# Patient Record
Sex: Female | Born: 1980 | Race: Black or African American | Hispanic: No | Marital: Married | Smoking: Never smoker
Health system: Southern US, Community
[De-identification: ages and names within clinical notes are randomized; demographics above are authoritative.]

## PROBLEM LIST (undated history)

## (undated) ENCOUNTER — Inpatient Hospital Stay (HOSPITAL_COMMUNITY): Payer: Self-pay

## (undated) DIAGNOSIS — Z9889 Other specified postprocedural states: Secondary | ICD-10-CM

## (undated) DIAGNOSIS — D649 Anemia, unspecified: Secondary | ICD-10-CM

## (undated) DIAGNOSIS — Z8669 Personal history of other diseases of the nervous system and sense organs: Secondary | ICD-10-CM

---

## 2003-10-15 HISTORY — PX: WISDOM TOOTH EXTRACTION: SHX21

## 2004-09-09 ENCOUNTER — Emergency Department (HOSPITAL_COMMUNITY): Admission: EM | Admit: 2004-09-09 | Discharge: 2004-09-09 | Payer: Self-pay | Admitting: Emergency Medicine

## 2005-02-27 ENCOUNTER — Emergency Department (HOSPITAL_COMMUNITY): Admission: EM | Admit: 2005-02-27 | Discharge: 2005-02-28 | Payer: Self-pay | Admitting: Emergency Medicine

## 2005-03-07 ENCOUNTER — Other Ambulatory Visit: Admission: RE | Admit: 2005-03-07 | Discharge: 2005-03-07 | Payer: Self-pay | Admitting: Obstetrics and Gynecology

## 2005-03-14 ENCOUNTER — Emergency Department (HOSPITAL_COMMUNITY): Admission: EM | Admit: 2005-03-14 | Discharge: 2005-03-14 | Payer: Self-pay | Admitting: Emergency Medicine

## 2005-07-09 ENCOUNTER — Other Ambulatory Visit: Admission: RE | Admit: 2005-07-09 | Discharge: 2005-07-09 | Payer: Self-pay | Admitting: Obstetrics and Gynecology

## 2007-07-14 ENCOUNTER — Emergency Department (HOSPITAL_COMMUNITY): Admission: EM | Admit: 2007-07-14 | Discharge: 2007-07-14 | Payer: Self-pay | Admitting: Emergency Medicine

## 2008-07-14 ENCOUNTER — Emergency Department (HOSPITAL_COMMUNITY): Admission: EM | Admit: 2008-07-14 | Discharge: 2008-07-14 | Payer: Self-pay | Admitting: Emergency Medicine

## 2009-02-06 ENCOUNTER — Ambulatory Visit: Payer: Self-pay | Admitting: Gynecology

## 2009-11-27 ENCOUNTER — Ambulatory Visit: Payer: Self-pay | Admitting: Gynecology

## 2010-01-25 ENCOUNTER — Ambulatory Visit: Payer: Self-pay | Admitting: Gynecology

## 2010-02-13 ENCOUNTER — Other Ambulatory Visit: Admission: RE | Admit: 2010-02-13 | Discharge: 2010-02-13 | Payer: Self-pay | Admitting: Gynecology

## 2010-02-13 ENCOUNTER — Ambulatory Visit: Payer: Self-pay | Admitting: Gynecology

## 2010-05-11 ENCOUNTER — Ambulatory Visit: Payer: Self-pay | Admitting: Gynecology

## 2010-05-14 ENCOUNTER — Ambulatory Visit: Payer: Self-pay | Admitting: Gynecology

## 2010-05-21 ENCOUNTER — Ambulatory Visit: Payer: Self-pay | Admitting: Gynecology

## 2010-05-24 ENCOUNTER — Ambulatory Visit: Payer: Self-pay | Admitting: Gynecology

## 2011-01-14 ENCOUNTER — Inpatient Hospital Stay (HOSPITAL_COMMUNITY): Admission: AD | Admit: 2011-01-14 | Payer: Self-pay | Admitting: Obstetrics & Gynecology

## 2011-01-22 ENCOUNTER — Inpatient Hospital Stay (HOSPITAL_COMMUNITY)
Admission: RE | Admit: 2011-01-22 | Discharge: 2011-01-24 | DRG: 373 | Disposition: A | Discharge status: Home / Self Care | Payer: BC Managed Care – PPO | Source: Ambulatory Visit | Attending: Obstetrics and Gynecology | Admitting: Obstetrics and Gynecology

## 2011-01-22 DIAGNOSIS — O99892 Other specified diseases and conditions complicating childbirth: Secondary | ICD-10-CM | POA: Diagnosis present

## 2011-01-22 DIAGNOSIS — Z22330 Carrier of Group B streptococcus: Secondary | ICD-10-CM

## 2011-01-22 DIAGNOSIS — O480 Post-term pregnancy: Principal | ICD-10-CM | POA: Diagnosis present

## 2011-01-22 LAB — CBC
MCH: 24.3 pg — ABNORMAL LOW (ref 26.0–34.0)
MCV: 76.2 fL — ABNORMAL LOW (ref 78.0–100.0)
Platelets: 189 10*3/uL (ref 150–400)
RBC: 4.37 MIL/uL (ref 3.87–5.11)

## 2011-01-22 LAB — ABO/RH: ABO/RH(D): O POS

## 2011-01-22 LAB — RPR: RPR Ser Ql: NONREACTIVE

## 2011-01-23 LAB — CBC
HCT: 28.9 % — ABNORMAL LOW (ref 36.0–46.0)
MCH: 24.4 pg — ABNORMAL LOW (ref 26.0–34.0)
RBC: 3.77 MIL/uL — ABNORMAL LOW (ref 3.87–5.11)
RDW: 15.8 % — ABNORMAL HIGH (ref 11.5–15.5)
WBC: 9.5 10*3/uL (ref 4.0–10.5)

## 2011-02-21 ENCOUNTER — Other Ambulatory Visit: Payer: Self-pay | Admitting: Obstetrics and Gynecology

## 2013-10-14 HISTORY — PX: BREAST BIOPSY: SHX20

## 2013-10-14 NOTE — L&D Delivery Note (Signed)
Delivery Note Samantha Sweeney presented to MAU with complaints of contractions.  SROM occurred in MAU.  Immediately upon arrival to L&D, she felt the urge to push.  Prior to my arrival, at 4:33 AM a viable female was delivered via Vaginal, Spontaneous Delivery (Presentation: Left Occiput Anterior) by Dr. Josephine Cables, MD.  Upon my arrival to the room, the placenta was still in situ, and baby was skin to skin with mom.  I clamped and cut the cord, and the delivered the placenta without difficulty.  APGAR: 9, 9; weight .   Placenta status: Intact, Spontaneous.  Cord: 3 vessels with the following complications: None.    Anesthesia: None  Episiotomy: None Lacerations: 1st degree;Perineal, hemostatic Suture Repair: n/a Est. Blood Loss (mL): 300  Mom to postpartum.  Baby to Couplet care / Skin to Skin.  Jerelyn Charles 06/28/2014, 4:58 AM

## 2013-11-05 LAB — OB RESULTS CONSOLE GC/CHLAMYDIA
CHLAMYDIA, DNA PROBE: NEGATIVE
Gonorrhea: NEGATIVE

## 2014-01-05 LAB — OB RESULTS CONSOLE HGB/HCT, BLOOD
HCT: 37 %
Hemoglobin: 11.1 g/dL

## 2014-01-05 LAB — OB RESULTS CONSOLE ABO/RH: RH TYPE: POSITIVE

## 2014-01-05 LAB — OB RESULTS CONSOLE HEPATITIS B SURFACE ANTIGEN: Hepatitis B Surface Ag: NEGATIVE

## 2014-01-05 LAB — OB RESULTS CONSOLE RUBELLA ANTIBODY, IGM: RUBELLA: IMMUNE

## 2014-01-05 LAB — OB RESULTS CONSOLE HIV ANTIBODY (ROUTINE TESTING): HIV: NONREACTIVE

## 2014-01-05 LAB — OB RESULTS CONSOLE RPR: RPR: NONREACTIVE

## 2014-01-05 LAB — OB RESULTS CONSOLE ANTIBODY SCREEN: Antibody Screen: NEGATIVE

## 2014-03-24 ENCOUNTER — Other Ambulatory Visit: Payer: Self-pay | Admitting: Obstetrics and Gynecology

## 2014-03-24 DIAGNOSIS — N63 Unspecified lump in unspecified breast: Secondary | ICD-10-CM

## 2014-03-28 ENCOUNTER — Ambulatory Visit
Admission: RE | Admit: 2014-03-28 | Discharge: 2014-03-28 | Disposition: A | Discharge status: Home / Self Care | Payer: Private Health Insurance - Indemnity | Source: Ambulatory Visit | Attending: Obstetrics and Gynecology | Admitting: Obstetrics and Gynecology

## 2014-03-28 ENCOUNTER — Other Ambulatory Visit: Payer: Self-pay | Admitting: Obstetrics and Gynecology

## 2014-03-28 DIAGNOSIS — N63 Unspecified lump in unspecified breast: Secondary | ICD-10-CM

## 2014-03-29 ENCOUNTER — Ambulatory Visit
Admission: RE | Admit: 2014-03-29 | Discharge: 2014-03-29 | Disposition: A | Discharge status: Home / Self Care | Payer: Private Health Insurance - Indemnity | Source: Ambulatory Visit | Attending: Obstetrics and Gynecology | Admitting: Obstetrics and Gynecology

## 2014-03-29 ENCOUNTER — Other Ambulatory Visit: Payer: Self-pay | Admitting: Obstetrics and Gynecology

## 2014-03-29 ENCOUNTER — Ambulatory Visit
Admission: RE | Admit: 2014-03-29 | Discharge: 2014-03-29 | Disposition: A | Discharge status: Home / Self Care | Payer: Managed Care, Other (non HMO) | Source: Ambulatory Visit | Attending: Obstetrics and Gynecology | Admitting: Obstetrics and Gynecology

## 2014-03-29 DIAGNOSIS — N63 Unspecified lump in unspecified breast: Secondary | ICD-10-CM

## 2014-04-28 ENCOUNTER — Ambulatory Visit (INDEPENDENT_AMBULATORY_CARE_PROVIDER_SITE_OTHER): Payer: Managed Care, Other (non HMO) | Admitting: Surgery

## 2014-04-28 ENCOUNTER — Encounter (INDEPENDENT_AMBULATORY_CARE_PROVIDER_SITE_OTHER): Payer: Self-pay | Admitting: Surgery

## 2014-04-28 VITALS — BP 118/70 | HR 68 | Resp 14 | Ht 65.5 in | Wt 193.6 lb

## 2014-04-28 DIAGNOSIS — N611 Abscess of the breast and nipple: Secondary | ICD-10-CM | POA: Insufficient documentation

## 2014-04-28 DIAGNOSIS — N61 Mastitis without abscess: Secondary | ICD-10-CM

## 2014-04-28 NOTE — Progress Notes (Signed)
Chief Complaint:  Mass in left breast-abscess by core biopsy-[redacted] weeks pregnant  History of Present Illness:  Samantha Sweeney is an 33 y.o. female who is followed by Dr. Vanessa Kick.  She had a core biopsy that showed giant cells in an abscess cavity.  She is [redacted] weeks pregnant and wants to breast feed for 1 year after her baby is born (due Sept 15th).    No past medical history on file.  No past surgical history on file.  Current Outpatient Prescriptions  Medication Sig Dispense Refill  . Multiple Vitamin (MULTI VITAMIN DAILY PO) Take by mouth.       No current facility-administered medications for this visit.   Review of patient's allergies indicates no known allergies. Family History  Problem Relation Age of Onset  . Breast cancer Mother 47    diag 2 different times  . Cervical cancer Maternal Grandmother    Social History:   reports that she has never smoked. She does not have any smokeless tobacco history on file. Her alcohol and drug histories are not on file.   REVIEW OF SYSTEMS : Positive for ## ; otherwise negative  Physical Exam:   Blood pressure 118/70, pulse 68, resp. rate 14, height 5' 5.5" (1.664 m), weight 193 lb 9.6 oz (87.816 kg). Body mass index is 31.72 kg/(m^2).  Gen:  WDWN AAF NAD  Neurological: Alert and oriented to person, place, and time. Motor and sensory function is grossly intact  Head: Normocephalic and atraumatic.  Eyes: Conjunctivae are normal. Pupils are equal, round, and reactive to light. No scleral icterus.  Neck: Normal range of motion. Neck supple. No tracheal deviation or thyromegaly present.  Cardiovascular:  SR without murmurs or gallops.  No carotid bruits Breast:  Right breast without masses.  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  It is nontender and not red.   Respiratory: Effort normal.  No respiratory distress. No chest wall tenderness. Breath sounds normal.  No  wheezes, rales or rhonchi.  Abdomen:  Not examined GU:  Not examined Musculoskeletal: Normal range of motion. Extremities are nontender. No cyanosis, edema or clubbing noted Lymphadenopathy: No cervical, preauricular, postauricular or axillary adenopathy is present Skin: Skin is warm and dry. No rash noted. No diaphoresis. No erythema. No pallor. Pscyh: Normal mood and affect. Behavior is normal. Judgment and thought content normal.   LABORATORY RESULTS: No results found for this or any previous visit (from the past 48 hour(s)).   RADIOLOGY RESULTS: No results found.  Problem List: Patient Active Problem List   Diagnosis Date Noted  . Left breast abscess 04/28/2014    Assessment & Plan: Left breast abscess-chronic Discussed management and will observe at the present.  Will see her back in 3 weeks.  She completed her last course of antibiotics 3 weeks ago-will observe for now.      Matt B. Hassell Done, MD, [facility] [phone] beeper [phone]  04/28/2014 1:18 PM

## 2014-05-09 ENCOUNTER — Ambulatory Visit (INDEPENDENT_AMBULATORY_CARE_PROVIDER_SITE_OTHER): Payer: Managed Care, Other (non HMO) | Admitting: General Surgery

## 2014-05-10 ENCOUNTER — Encounter (INDEPENDENT_AMBULATORY_CARE_PROVIDER_SITE_OTHER): Payer: Self-pay | Admitting: Surgery

## 2014-05-10 ENCOUNTER — Ambulatory Visit (INDEPENDENT_AMBULATORY_CARE_PROVIDER_SITE_OTHER): Payer: Managed Care, Other (non HMO) | Admitting: Surgery

## 2014-05-10 VITALS — BP 126/80 | HR 75 | Temp 97.0°F | Ht 65.0 in | Wt 194.0 lb

## 2014-05-10 DIAGNOSIS — N61 Mastitis without abscess: Secondary | ICD-10-CM

## 2014-05-10 DIAGNOSIS — N611 Abscess of the breast and nipple: Secondary | ICD-10-CM

## 2014-05-10 MED ORDER — SULFAMETHOXAZOLE-TRIMETHOPRIM 200-40 MG/5ML PO SUSP: 20.0000 mL | Freq: Two times a day (BID) | ORAL | Status: DC

## 2014-05-10 MED ORDER — DOXYCYCLINE HYCLATE 100 MG PO TABS: 100.0000 mg | ORAL_TABLET | Freq: Two times a day (BID) | ORAL | Status: DC

## 2014-05-10 NOTE — Progress Notes (Signed)
Subjective:     Patient ID: Samantha Sweeney, female   DOB: 1981-05-02, 33 y.o.   MRN: [id number]  HPI  This is a patient who was seen by Dr. Hassell Done a week and half ago with a chronic left breast abscess. She is nearing the end of pregnancy. She came in today because an area opened up draining some purulent fluid. Review of Systems     Objective:   Physical Exam On exam, there is a firm area at the 12:00 position left breast. Inferior to this is a small open area draining clear fluid. After obtaining consent and discussing this with her, I prepped the area Betadine, in the size of lidocaine, an incision with scalpel, and entered an abscess cavity. There was minimal purulent fluid remaining. I then packed it with gauze    Assessment:     Left breast abscess     Plan:     I will place her on Septra suspension as she does not want pills. She will keep her appointment with Dr. Hassell Done in 2 days for wound check

## 2014-05-12 ENCOUNTER — Ambulatory Visit (INDEPENDENT_AMBULATORY_CARE_PROVIDER_SITE_OTHER): Payer: Managed Care, Other (non HMO) | Admitting: Surgery

## 2014-05-12 ENCOUNTER — Encounter (INDEPENDENT_AMBULATORY_CARE_PROVIDER_SITE_OTHER): Payer: Self-pay | Admitting: Surgery

## 2014-05-12 VITALS — BP 126/70 | HR 75 | Temp 98.0°F | Ht 65.0 in | Wt 194.0 lb

## 2014-05-12 DIAGNOSIS — O0001 Abdominal pregnancy with intrauterine pregnancy: Secondary | ICD-10-CM

## 2014-05-12 DIAGNOSIS — N611 Abscess of the breast and nipple: Secondary | ICD-10-CM

## 2014-05-12 DIAGNOSIS — N61 Mastitis without abscess: Secondary | ICD-10-CM

## 2014-05-12 NOTE — Progress Notes (Signed)
Samantha Sweeney 33 y.o.  Body mass index is 32.28 kg/(m^2).  Patient Active Problem List   Diagnosis Date Noted  . Left breast abscess 04/28/2014    No Known Allergies    History reviewed. No pertinent past surgical history. No primary provider on file. No diagnosis found.  [redacted] weeks pregnant with draining area in the left breast. Was on Keflex per Dr. Harrington Challenger.  I told her that we can stop the packing that was begun when [facility] saw her with spontaneous drainage from this area.  Today it looks clean and no drainage was noted.  Will see back in 4 weeks.   Matt B. Hassell Done, MD, [facility] [phone] beeper [phone]  05/12/2014 12:03 PM

## 2014-05-12 NOTE — Patient Instructions (Signed)
Complete course of Keflex Apply gauze dressing to absorb drainage.

## 2014-05-25 ENCOUNTER — Telehealth (INDEPENDENT_AMBULATORY_CARE_PROVIDER_SITE_OTHER): Payer: Self-pay | Admitting: *Deleted

## 2014-05-25 NOTE — Telephone Encounter (Signed)
error 

## 2014-05-26 ENCOUNTER — Ambulatory Visit (INDEPENDENT_AMBULATORY_CARE_PROVIDER_SITE_OTHER): Payer: Managed Care, Other (non HMO) | Admitting: Surgery

## 2014-05-26 ENCOUNTER — Telehealth (INDEPENDENT_AMBULATORY_CARE_PROVIDER_SITE_OTHER): Payer: Self-pay

## 2014-05-26 ENCOUNTER — Encounter (INDEPENDENT_AMBULATORY_CARE_PROVIDER_SITE_OTHER): Payer: Self-pay | Admitting: Surgery

## 2014-05-26 VITALS — BP 130/70 | HR 79 | Temp 97.9°F | Ht 65.0 in | Wt 195.5 lb

## 2014-05-26 DIAGNOSIS — N611 Abscess of the breast and nipple: Secondary | ICD-10-CM

## 2014-05-26 DIAGNOSIS — N61 Mastitis without abscess: Secondary | ICD-10-CM

## 2014-05-26 NOTE — Progress Notes (Signed)
The patient returns to urgent office today for evaluation of recurrent left breast abscess. The wound that had previously been opened appears clean with minimal drainage. However just lateral to this area and the upper breast, there is a small opening with some purulent drainage. She states that this is where she had the previous needle biopsy. She has also noticed some tender fluctuance below this area near the edge of the nipple.  Filed Vitals:   05/26/14 1637  BP: 130/70  Pulse: 79  Temp: 97.9 F (36.6 C)   The open wound in the upper left breast measures about 1.5 cm and is well granulated. 2 cm lateral to this area there is trace punctate opening with purulent drainage. There is some fluctuance in this area. About 2 cm below this area there is a oval shaped transversely oriented fluctuant area measuring 2 x 4 cm. This is very tender.  There is significant firmness in the upper medial part of the left breast.  I prepped the left breast with Betadine and anesthetized with 1% lidocaine. The smaller superior area was opened and a small amount of purulent fluid was expressed. I explored this tunnel with a cotton swab and no further purulence was noted. We packed the wound with quarter inch Nu Gauze. I then opened the fluctuant area inferiorly and a large amount of purulent fluid was expressed. Once we completely evacuate the abscess and packed with gauze. She will remove both areas of packing in 48 hours. She will cover all 3 areas with antibiotic ointment. I will order an ultrasound to see if there are any undrained deeper fluid collections or tunnels.  We will recheck her in 2 weeks or sooner if needed.  Imogene Burn. Georgette Dover, MD, [facility]  General/ Trauma Surgery  05/26/2014 6:09 PM

## 2014-05-26 NOTE — Telephone Encounter (Signed)
Order for u/s asap to ref coord .

## 2014-05-27 ENCOUNTER — Ambulatory Visit
Admission: RE | Admit: 2014-05-27 | Discharge: 2014-05-27 | Disposition: A | Discharge status: Home / Self Care | Payer: Private Health Insurance - Indemnity | Source: Ambulatory Visit | Attending: Surgery | Admitting: Surgery

## 2014-05-27 DIAGNOSIS — N611 Abscess of the breast and nipple: Secondary | ICD-10-CM

## 2014-05-30 ENCOUNTER — Telehealth (INDEPENDENT_AMBULATORY_CARE_PROVIDER_SITE_OTHER): Payer: Self-pay

## 2014-05-30 NOTE — Telephone Encounter (Signed)
Informed pt of Dr Georgette Dover message below with the answers to her question/concerns to her procedure. Pt states that she has appt with her OB dr tomorrow am @ 8. Pt states that she wants to keep that so that she can go over everything with their office. Informed pt that I would let Dr Georgette Dover know. Pt verbalized understanding

## 2014-05-30 NOTE — Telephone Encounter (Signed)
Called pt to see how she was feeling after her breast ultrasound. Pt states that she has been doing good, no issues to report. Informed pt that Dr Georgette Dover would like to drain her abscess in the OR. Pt states that she would like to go over all this with her husband before making a decision. She states however that if this needs to be done she would like to proceed with this, this week while Dr Georgette Dover is DOW. Pt states that she will call me back to inform me what they have decided. Informed pt that I would make Dr Georgette Dover aware. Pt verbalized understanding.

## 2014-05-30 NOTE — Telephone Encounter (Signed)
Pt called back she would like to go ahead and proceed with this. Pt has some concerns, she wanted to know how will this affect her breastfeeding once baby is born, what type of sedation will be used and she is very nervous about anything affecting baby since she will be 36 weeks. Informed her that I would get with Dr Georgette Dover to discuss everything and I would call her back as soon as I received a response.

## 2014-05-30 NOTE — Telephone Encounter (Signed)
This will have to be done under general anesthetic or at least very deep sedation.  She can discuss the risks with her OB.  This will likely keep her from breastfeeding on that side for several weeks until the wound heals up.  It is difficult to predict how extensive the abscess extends, but it looks fairly large on ultrasound.  I understand that she is concerned because of the pregnancy, but I think that it would be riskier to leave the abscess there.  She can come for admission to [facility] today if she would like, but we probably won't have time to drain this until tomorrow.  She can come tomorrow if she would rather not spend the night in the hospital.

## 2014-05-31 ENCOUNTER — Observation Stay (HOSPITAL_COMMUNITY)
Admission: AD | Admit: 2014-05-31 | Discharge: 2014-06-01 | Disposition: A | Discharge status: Home / Self Care | Payer: Managed Care, Other (non HMO) | Source: Ambulatory Visit | Attending: Obstetrics and Gynecology | Admitting: Obstetrics and Gynecology

## 2014-05-31 ENCOUNTER — Encounter (HOSPITAL_COMMUNITY)
Admission: AD | Disposition: A | Discharge status: Home / Self Care | Payer: Self-pay | Source: Ambulatory Visit | Attending: Obstetrics and Gynecology

## 2014-05-31 ENCOUNTER — Encounter (HOSPITAL_COMMUNITY): Payer: Managed Care, Other (non HMO) | Admitting: Anesthesiology

## 2014-05-31 ENCOUNTER — Encounter (HOSPITAL_COMMUNITY): Payer: Self-pay | Admitting: *Deleted

## 2014-05-31 ENCOUNTER — Observation Stay (HOSPITAL_COMMUNITY): Payer: Managed Care, Other (non HMO) | Admitting: Anesthesiology

## 2014-05-31 DIAGNOSIS — N61 Mastitis without abscess: Secondary | ICD-10-CM

## 2014-05-31 DIAGNOSIS — O9989 Other specified diseases and conditions complicating pregnancy, childbirth and the puerperium: Principal | ICD-10-CM

## 2014-05-31 DIAGNOSIS — O99891 Other specified diseases and conditions complicating pregnancy: Principal | ICD-10-CM | POA: Insufficient documentation

## 2014-05-31 DIAGNOSIS — O91119 Abscess of breast associated with pregnancy, unspecified trimester: Secondary | ICD-10-CM | POA: Diagnosis present

## 2014-05-31 HISTORY — PX: BREAST CYST EXCISION: SHX579

## 2014-05-31 LAB — CBC
HCT: 33.7 % — ABNORMAL LOW (ref 36.0–46.0)
Hemoglobin: 11.2 g/dL — ABNORMAL LOW (ref 12.0–15.0)
MCH: 25.3 pg — ABNORMAL LOW (ref 26.0–34.0)
MCHC: 33.2 g/dL (ref 30.0–36.0)
MCV: 76.1 fL — ABNORMAL LOW (ref 78.0–100.0)
Platelets: 241 10*3/uL (ref 150–400)
RBC: 4.43 MIL/uL (ref 3.87–5.11)
RDW: 14 % (ref 11.5–15.5)
WBC: 8.5 10*3/uL (ref 4.0–10.5)

## 2014-05-31 LAB — OB RESULTS CONSOLE GBS: GBS: NEGATIVE

## 2014-05-31 SURGERY — EXCISION, CYST, BREAST
Anesthesia: General | Site: Breast

## 2014-05-31 MED ORDER — PRENATAL MULTIVITAMIN CH: 1.0000 | ORAL_TABLET | Freq: Every day | ORAL | Status: DC

## 2014-05-31 MED ORDER — LACTATED RINGERS IV SOLN
INTRAVENOUS | Status: DC
  Administered 2014-05-31 (×2): via INTRAVENOUS

## 2014-05-31 MED ORDER — MORPHINE SULFATE 4 MG/ML IJ SOLN: 2.0000 mg | INTRAMUSCULAR | Status: DC | PRN

## 2014-05-31 MED ORDER — CITRIC ACID-SODIUM CITRATE 334-500 MG/5ML PO SOLN
ORAL | Status: AC
  Administered 2014-05-31: 30 mL
  Filled 2014-05-31: qty 15

## 2014-05-31 MED ORDER — NEOSTIGMINE METHYLSULFATE 10 MG/10ML IV SOLN
INTRAVENOUS | Status: AC
  Filled 2014-05-31: qty 1

## 2014-05-31 MED ORDER — BUPIVACAINE HCL (PF) 0.25 % IJ SOLN
INTRAMUSCULAR | Status: AC
  Filled 2014-05-31: qty 30

## 2014-05-31 MED ORDER — ONDANSETRON HCL 4 MG/2ML IJ SOLN
INTRAMUSCULAR | Status: AC
  Filled 2014-05-31: qty 2

## 2014-05-31 MED ORDER — ONDANSETRON HCL 4 MG/2ML IJ SOLN
INTRAMUSCULAR | Status: DC | PRN
  Administered 2014-05-31: 4 mg via INTRAVENOUS

## 2014-05-31 MED ORDER — DIPHENHYDRAMINE HCL 25 MG PO CAPS: 25.0000 mg | ORAL_CAPSULE | Freq: Four times a day (QID) | ORAL | Status: DC | PRN

## 2014-05-31 MED ORDER — PROPOFOL 10 MG/ML IV BOLUS
INTRAVENOUS | Status: DC | PRN
  Administered 2014-05-31: 150 mg via INTRAVENOUS

## 2014-05-31 MED ORDER — FENTANYL CITRATE 0.05 MG/ML IJ SOLN
INTRAMUSCULAR | Status: AC
  Filled 2014-05-31: qty 5

## 2014-05-31 MED ORDER — DIPHENHYDRAMINE HCL 50 MG/ML IJ SOLN
25.0000 mg | Freq: Once | INTRAMUSCULAR | Status: AC
  Administered 2014-05-31: 25 mg via INTRAVENOUS

## 2014-05-31 MED ORDER — PHENYLEPHRINE 40 MCG/ML (10ML) SYRINGE FOR IV PUSH (FOR BLOOD PRESSURE SUPPORT)
PREFILLED_SYRINGE | INTRAVENOUS | Status: AC
  Filled 2014-05-31: qty 5

## 2014-05-31 MED ORDER — FENTANYL CITRATE 0.05 MG/ML IJ SOLN
INTRAMUSCULAR | Status: DC | PRN
  Administered 2014-05-31: 100 ug via INTRAVENOUS
  Administered 2014-05-31: 50 ug via INTRAVENOUS
  Administered 2014-05-31: 100 ug via INTRAVENOUS

## 2014-05-31 MED ORDER — SUCCINYLCHOLINE CHLORIDE 20 MG/ML IJ SOLN
INTRAMUSCULAR | Status: AC
  Filled 2014-05-31: qty 10

## 2014-05-31 MED ORDER — HYDROMORPHONE HCL PF 1 MG/ML IJ SOLN: 0.5000 mg | INTRAMUSCULAR | Status: DC | PRN

## 2014-05-31 MED ORDER — GLYCOPYRROLATE 0.2 MG/ML IJ SOLN
INTRAMUSCULAR | Status: AC
  Filled 2014-05-31: qty 4

## 2014-05-31 MED ORDER — FENTANYL CITRATE 0.05 MG/ML IJ SOLN
25.0000 ug | INTRAMUSCULAR | Status: DC | PRN
  Administered 2014-05-31: 25 ug via INTRAVENOUS
  Administered 2014-05-31: 50 ug via INTRAVENOUS

## 2014-05-31 MED ORDER — CALCIUM CARBONATE ANTACID 500 MG PO CHEW: 2.0000 | CHEWABLE_TABLET | ORAL | Status: DC | PRN

## 2014-05-31 MED ORDER — SULFAMETHOXAZOLE-TRIMETHOPRIM 200-40 MG/5ML PO SUSP: 20.0000 mL | Freq: Two times a day (BID) | ORAL | Status: DC

## 2014-05-31 MED ORDER — PHENYLEPHRINE HCL 10 MG/ML IJ SOLN
INTRAMUSCULAR | Status: DC | PRN
  Administered 2014-05-31: 80 ug via INTRAVENOUS
  Administered 2014-05-31 (×2): 40 ug via INTRAVENOUS
  Administered 2014-05-31 (×2): 80 ug via INTRAVENOUS

## 2014-05-31 MED ORDER — DOCUSATE SODIUM 100 MG PO CAPS: 100.0000 mg | ORAL_CAPSULE | Freq: Every day | ORAL | Status: DC

## 2014-05-31 MED ORDER — LIDOCAINE HCL (CARDIAC) 20 MG/ML IV SOLN
INTRAVENOUS | Status: DC | PRN
  Administered 2014-05-31: 20 mg via INTRAVENOUS

## 2014-05-31 MED ORDER — BUPIVACAINE HCL (PF) 0.5 % IJ SOLN
INTRAMUSCULAR | Status: AC
  Filled 2014-05-31: qty 30

## 2014-05-31 MED ORDER — HYDROCODONE-ACETAMINOPHEN 5-325 MG PO TABS: 1.0000 | ORAL_TABLET | ORAL | Status: DC | PRN

## 2014-05-31 MED ORDER — BUPIVACAINE HCL 0.5 % IJ SOLN
INTRAMUSCULAR | Status: DC | PRN
  Administered 2014-05-31: 13 mL

## 2014-05-31 MED ORDER — SULFAMETHOXAZOLE-TMP DS 800-160 MG PO TABS
1.0000 | ORAL_TABLET | Freq: Two times a day (BID) | ORAL | Status: DC
Start: 2014-05-31 — End: 2014-06-01
  Administered 2014-05-31: 1 via ORAL
  Filled 2014-05-31 (×2): qty 1

## 2014-05-31 MED ORDER — SUCCINYLCHOLINE CHLORIDE 20 MG/ML IJ SOLN
INTRAMUSCULAR | Status: DC | PRN
  Administered 2014-05-31: 120 mg via INTRAVENOUS

## 2014-05-31 MED ORDER — ACETAMINOPHEN 325 MG PO TABS: 650.0000 mg | ORAL_TABLET | ORAL | Status: DC | PRN

## 2014-05-31 MED ORDER — FENTANYL CITRATE 0.05 MG/ML IJ SOLN
INTRAMUSCULAR | Status: AC
  Filled 2014-05-31: qty 2

## 2014-05-31 MED ORDER — DIPHENHYDRAMINE HCL 50 MG/ML IJ SOLN
INTRAMUSCULAR | Status: AC
  Filled 2014-05-31: qty 1

## 2014-05-31 MED ORDER — LACTATED RINGERS IV SOLN
INTRAVENOUS | Status: DC
Start: 2014-05-31 — End: 2014-06-01
  Administered 2014-06-01: 02:00:00 via INTRAVENOUS

## 2014-05-31 MED ORDER — PROPOFOL 10 MG/ML IV EMUL
INTRAVENOUS | Status: AC
  Filled 2014-05-31: qty 20

## 2014-05-31 MED ORDER — LIDOCAINE HCL (CARDIAC) 20 MG/ML IV SOLN
INTRAVENOUS | Status: AC
  Filled 2014-05-31: qty 5

## 2014-05-31 MED ORDER — ZOLPIDEM TARTRATE 5 MG PO TABS: 5.0000 mg | ORAL_TABLET | Freq: Every evening | ORAL | Status: DC | PRN

## 2014-05-31 SURGICAL SUPPLY — 3 items
PAD ABD 8X7 1/2 STERILE (GAUZE/BANDAGES/DRESSINGS) ×2 IMPLANT
SUT ETHILON 3 0 PS 1 18 (SUTURE) ×6 IMPLANT
TAPE CLOTH SURG 4X10 WHT LF (GAUZE/BANDAGES/DRESSINGS) ×2 IMPLANT

## 2014-05-31 NOTE — Op Note (Signed)
Operative Note  Samantha Sweeney female 33 y.o. 05/31/2014  PREOPERATIVE DX:  Chronic left breast abscesses  POSTOPERATIVE DX:  Same  PROCEDURE:complex incision, drainage, and debridement of chronic left breast abscess         Surgeon: Odis Hollingshead   Assistants: none  Anesthesia: General endotracheal anesthesia  Indications: this is a 33 year old female who is now [redacted] weeks pregnant. She's had left breast abscesses that have failed medical treatment and simple incision and drainage. She has one in the upper inner quadrant of the left breast and one in the retroareolar position. She now presents for the above procedure.    Procedure Detail:  Her left breast was marked with my initials. She is brought to the operating room placed supine on the operating table and general anesthetic was given. The left breast was sterilely prepped and draped.  There was a previous incision in the 12:00 area. Using a hemostat I bluntly opened up this incision and tunneled toward the upper inner quadrant. I entered a cavity of purulent fluid which was cultured and then evacuated by way of suction. I then extended this incision more medially. I broke up multiple loculated cavities of purulent fluid and debrided some necrotic breast tissue. Bleeding was controlled electrocautery.  There was a small incision just at superior to the nipple areolar complex. Using a hemostat I opened this incision up and got into the retroareolar space where I drained another abscess.  Next, a half-inch Penrose drain was placed through the more superior incision. The tip a drain was placed in the base of the abscess cavity. A quarter-inch Penrose drain was placed in the lateral aspect of the larger incision and placed into the retroareolar cavity. Both drains were then anchored to the skin using nylon suture and a safety pin. 0.5 percent Marcaine was infiltrated into the wounds for local anesthetic effect. A bulky dressing was  applied.  She tolerated the procedures well without any apparent complications and was taken to the recovery in satisfactory condition. Good fetal heart tones were noted.   Estimated Blood Loss:  150 ml         Drains: PENROSE X 2         Blood Given: none          Specimens: abscess fluid sent for culture        Complications:  * No complications entered in OR log *         Disposition: PACU - hemodynamically stable.         Condition: stable

## 2014-05-31 NOTE — Progress Notes (Signed)
abcess noted on left breast with cellulitis around the abcess.  2 small open areas on the breast with no drainage noted.  abcess covered with gauze pad and paper tape by patient.

## 2014-05-31 NOTE — Anesthesia Preprocedure Evaluation (Signed)
Anesthesia Evaluation  Patient identified by MRN, date of birth, ID band Patient awake    Reviewed: Allergy & Precautions, H&P , Patient's Chart, lab work & pertinent test results, reviewed documented beta blocker date and time   Airway Mallampati: II TM Distance: >3 FB Neck ROM: full    Dental no notable dental hx.    Pulmonary  breath sounds clear to auscultation  Pulmonary exam normal       Cardiovascular Rhythm:regular Rate:Normal     Neuro/Psych    GI/Hepatic   Endo/Other    Renal/GU      Musculoskeletal   Abdominal   Peds  Hematology   Anesthesia Other Findings Will wait for NST and send for patient. Pre-op and post op FHR check. Na Citrate . No labs needed.  Reproductive/Obstetrics                           Anesthesia Physical Anesthesia Plan  ASA: II  Anesthesia Plan: General   Post-op Pain Management:    Induction: Intravenous, Cricoid pressure planned and Rapid sequence  Airway Management Planned: Oral ETT and Video Laryngoscope Planned  Additional Equipment:   Intra-op Plan:   Post-operative Plan: Extubation in OR  Informed Consent: I have reviewed the patients History and Physical, chart, labs and discussed the procedure including the risks, benefits and alternatives for the proposed anesthesia with the patient or authorized representative who has indicated his/her understanding and acceptance.   Dental Advisory Given and Dental advisory given  Plan Discussed with: CRNA and Surgeon  Anesthesia Plan Comments: (  Discussed general anesthesia, including possible nausea, instrumentation of airway, sore throat,pulmonary aspiration, etc. I asked if the were any outstanding questions, or  concerns before we proceeded. )        Anesthesia Quick Evaluation

## 2014-05-31 NOTE — Progress Notes (Signed)
Patient is comfortable, tired, tolerating liquids.  Denies feeling contractions, LOP or bleeding, denies fever, reports good fetal movement since in recovery room.  Reports some mild itching , otherwise no complaints.  Filed Vitals:   05/31/14 2000 05/31/14 2001 05/31/14 2006 05/31/14 2008  BP: 111/72     Pulse: 84 84 84 84  Temp: 97.3 F (36.3 C)     Resp: 20     Height:      Weight:      SpO2: 98%  98% 98%   Breast: Left dressing in place, drains in place Abd: gravid, NT Ext: no CT FHT: 135 mod var +accels no decels TOCO: ireg ctx  Lab Results  Component Value Date   WBC 8.5 05/31/2014   HGB 11.2* 05/31/2014   HCT 33.7* 05/31/2014   MCV 76.1* 05/31/2014   PLT 241 05/31/2014    POD#0 s/p complex I&D with debridement of abscess of Left breast PO benadryl for mild itching Spoke with RN regarding antibiotics ordered by surgery.  Pharmacy concerned with Septra given [place].  Asked RN to find out alternatives and discuss with surgery.  I discussed with patient risks of hyperbilirubinemia, jaundice/kernicterus with use near term.  Typically it is avoided after 36 weeks.  Given need for MRSA coverage pending culture results, other abx options may not be appropraite and risk benefit must be weighed.  I advised that a short course of Septra may be warranted, but longer course should be avoided.  Awaiting surgery input. Given Cat 1 tracing, will d/c continuous monitoring overnight to allow pt to sleep.  Advised her to alert RN to any problems overnight.  Allyn Kenner

## 2014-05-31 NOTE — Anesthesia Postprocedure Evaluation (Signed)
  Anesthesia Post Note  Patient: Samantha Sweeney  Procedure(s) Performed: Procedure(s) (LRB): CYST EXCISION BREAST (N/A)  Anesthesia type: GA  Patient location: PACU  Post pain: Pain level controlled  Post assessment: Post-op Vital signs reviewed  Last Vitals:  Filed Vitals:   05/31/14 1900  BP: 115/64  Pulse: 87  Temp:   Resp: 19    Post vital signs: Reviewed  Level of consciousness: sedated  Complications: No apparent anesthesia complications

## 2014-05-31 NOTE — OR Nursing (Signed)
FHR CHECKED UPON ARRIVAL TO ROON 140 BEATS / MINUTE  in OR

## 2014-05-31 NOTE — OR Nursing (Signed)
FHR 123 BEATS / MINUTE  AFTER PROCEDURE

## 2014-05-31 NOTE — Progress Notes (Signed)
Jackolyn Confer MD in to see pt

## 2014-05-31 NOTE — H&P (Signed)
Samantha Sweeney is an 33 y.o. female.   Chief Complaint:  Persistent left breast abscess HPI:   She is now [redacted] week pregnant and for the past 9-10 weeks has had a persistent left breast abscess that has failed simple incision and drainages in the office and 3 rounds of antibiotics. Recent ultrasound demonstrated a 9 cm abscess at the 11 to 12:00 position of the left breast approximately 8 cm from the nipple. The 12:00 position retroareolar was a second abscess measuring approximately 2.7 cm.  History reviewed. No pertinent past medical history.  Past Surgical History  Procedure Laterality Date  . Wisdom tooth extraction  2005    Family History  Problem Relation Age of Onset  . Breast cancer Mother 69    diag 2 different times  . Arthritis Mother   . Cancer Mother   . Cervical cancer Maternal Grandmother   . Cancer Maternal Grandmother   . Diabetes Father    Social History:  reports that she has never smoked. She does not have any smokeless tobacco history on file. She reports that she does not drink alcohol or use illicit drugs.  Allergies: No Known Allergies  Medications Prior to Admission  Medication Sig Dispense Refill  . Multiple Vitamin (MULTI VITAMIN DAILY PO) Take by mouth.        No results found for this or any previous visit (from the past 48 hour(s)). No results found.  Review of Systems  Constitutional: Negative for fever and chills.    Height 5\' 5"  (1.651 m), weight 196 lb (88.905 kg). Physical Exam  Constitutional: No distress.  overweight  HENT:  Head: Normocephalic and atraumatic.  Cardiovascular: Normal rate and regular rhythm.   Respiratory: Effort normal and breath sounds normal.  Left breast demonstrates an indurated fluctuant area in the upper inner quadrant. There are 3 small, shallow draining incisions at the 12:00 position just above the nipple areolar complex.  GI:  Palpable uterus well above the umbilicus  Neurological: She is alert.  Skin:  Skin is warm and dry.     Assessment/Plan Complex and persistent left breast abscesses that have failed previous management.  Plan: Incision and drainage of left breast abscesses in the operating room. The procedure and risks were discussed with her. Risks include but are not limited to bleeding, wound healing problems, anesthesia, preterm labor, need for second operations, no fistula, potentially will prolonged wound healing. We also talked about the possibility for recurrence. She seems to understand this and agrees with the plan.  Sydny Schnitzler J 05/31/2014, 4:57 PM

## 2014-05-31 NOTE — Transfer of Care (Signed)
Immediate Anesthesia Transfer of Care Note  Patient: Samantha Sweeney  Procedure(s) Performed: Procedure(s) with comments: CYST EXCISION BREAST (N/A) - complex incision, drainage, debridement of left breast abcesses  Patient Location: PACU  Anesthesia Type:General  Level of Consciousness: awake  Airway & Oxygen Therapy: Patient Spontanous Breathing and Patient connected to nasal cannula oxygen  Post-op Assessment: Report given to PACU RN and Post -op Vital signs reviewed and stable  Post vital signs: stable  Complications: No apparent anesthesia complications

## 2014-06-01 ENCOUNTER — Telehealth (INDEPENDENT_AMBULATORY_CARE_PROVIDER_SITE_OTHER): Payer: Self-pay

## 2014-06-01 DIAGNOSIS — O99891 Other specified diseases and conditions complicating pregnancy: Secondary | ICD-10-CM | POA: Diagnosis not present

## 2014-06-01 MED ORDER — CLINDAMYCIN HCL 300 MG PO CAPS: 600.0000 mg | ORAL_CAPSULE | Freq: Three times a day (TID) | ORAL | Status: DC

## 2014-06-01 MED ORDER — CLINDAMYCIN HCL 300 MG PO CAPS: 300.0000 mg | ORAL_CAPSULE | Freq: Three times a day (TID) | ORAL | Status: DC

## 2014-06-01 MED ORDER — HYDROCODONE-ACETAMINOPHEN 5-325 MG PO TABS: 1.0000 | ORAL_TABLET | ORAL | Status: DC | PRN

## 2014-06-01 NOTE — Telephone Encounter (Signed)
Pt returned Tonyas to triage.  Pt states that she is doing fine.  She is aware of her f/u appt 06-06-14.  Anderson Malta

## 2014-06-01 NOTE — Progress Notes (Signed)
Old dressing changed and noted the old dressing covered in bloody drainage.  Penrose drain intact with safety pins and new dressing applied with paper tape.

## 2014-06-01 NOTE — Progress Notes (Signed)
Pt given discharge instructions and verbalizes understanding.  Pt to change dressing daily and keep dry.  Pt to call MD if she starts to run a fever.

## 2014-06-01 NOTE — Addendum Note (Signed)
Addendum created 06/01/14 1042 by Jonna Munro, CRNA   Modules edited: Charges VN, Notes Section   Notes Section:  File: [id number]

## 2014-06-01 NOTE — Telephone Encounter (Signed)
LMOM for pt to call nursing triage.  Called to see how pt was this am after her procedure and to inform her of appt on 8/24 @ 3:40. Please remind pt to be her at least 55mins before.

## 2014-06-01 NOTE — Progress Notes (Signed)
33 y.o. G2P1001 [redacted]w[redacted]d HD#1 admitted for 36wks Left Breast  Breast abscess s/p I&D by Surgery.  Pt currently stable with no c/o.  Good FM.  Filed Vitals:   06/01/14 0629  BP: 102/51  Pulse: 74  Temp: 98.4 F (36.9 C)  Resp: 18     Lungs CTA Cor RRR Abd  Soft, gravid, nontender Ex SCDs FHTs  Last night- 120s, good short term variability, NST R Toco  q 10 last night  Results for orders placed during the hospital encounter of 05/31/14 (from the past 24 hour(s))  CBC     Status: Abnormal   Collection Time    05/31/14  4:49 PM      Result Value Ref Range   WBC 8.5  4.0 - 10.5 K/uL   RBC 4.43  3.87 - 5.11 MIL/uL   Hemoglobin 11.2 (*) 12.0 - 15.0 g/dL   HCT 33.7 (*) 36.0 - 46.0 %   MCV 76.1 (*) 78.0 - 100.0 fL   MCH 25.3 (*) 26.0 - 34.0 pg   MCHC 33.2  30.0 - 36.0 g/dL   RDW 14.0  11.5 - 15.5 %   Platelets 241  150 - 400 K/uL  WOUND CULTURE     Status: None   Collection Time    05/31/14  5:39 PM      Result Value Ref Range   Specimen Description BREAST LEFT     Special Requests NONE     Gram Stain       Value: RARE WBC PRESENT,BOTH PMN AND MONONUCLEAR     NO SQUAMOUS EPITHELIAL CELLS SEEN     NO ORGANISMS SEEN     Performed at [facility]   Culture       Value: NO GROWTH 1 DAY     Performed at [facility]   Report Status PENDING      A/P:  HD#1  [redacted]w[redacted]d s/p I&D of L breast abscess.  Awaiting NST for this am- if reactive and not having regular contractions, will DC.  Clindamycin 600  TID for one week and f/u with Surgery and OB in one week.      Debany Vantol A

## 2014-06-01 NOTE — Discharge Instructions (Signed)
May shower tomorrow.  Try to keep left breast dry.  Apply a dry pad to left breast wound daily.  Appointment with Dr. Zella Richer (surgeon) in one week.  Please call [phone] to make appointment.  Please call if you have any wound problems.

## 2014-06-01 NOTE — Progress Notes (Signed)
1 Day Post-Op  Subjective: Not having much pain.  Objective: Vital signs in last 24 hours: Temp:  [97.3 F (36.3 C)-98.4 F (36.9 C)] 98.4 F (36.9 C) (08/19 0629) Pulse Rate:  [67-103] 74 (08/19 0629) Resp:  [17-25] 18 (08/19 0629) BP: (95-115)/(51-74) 102/51 mmHg (08/19 0629) SpO2:  [94 %-99 %] 98 % (08/18 2008) Weight:  [196 lb (88.905 kg)] 196 lb (88.905 kg) (08/18 2000)    Intake/Output from previous day: 08/18 0701 - 08/19 0700 In: 1500 [I.V.:1500] Out: 39 [Blood:50] Intake/Output this shift: Total I/O In: 300 [I.V.:300] Out: -   PE: General- In NAD Left Breast-some dried drainage on dressing, penrose drains in  Lab Results:   Recent Labs  05/31/14 1649  WBC 8.5  HGB 11.2*  HCT 33.7*  PLT 241   BMET No results found for this basename: NA, K, CL, CO2, GLUCOSE, BUN, CREATININE, CALCIUM,  in the last 72 hours PT/INR No results found for this basename: LABPROT, INR,  in the last 72 hours Comprehensive Metabolic Panel: No results found for this basename: na, k, cl, co2, bun, creatinine, glucose, calcium, ast, alt, alkphos, bilitot, prot, albumin     Studies/Results: No results found.  Anti-infectives: Anti-infectives   Start     Dose/Rate Route Frequency Ordered Stop   05/31/14 2200  sulfamethoxazole-trimethoprim (BACTRIM,SEPTRA) 200-40 MG/5ML suspension 20 mL  Status:  Discontinued     20 mL Oral Every 12 hours 05/31/14 2002 05/31/14 2125   05/31/14 2200  sulfamethoxazole-trimethoprim (BACTRIM DS) 800-160 MG per tablet 1 tablet  Status:  Discontinued     1 tablet Oral Every 12 hours 05/31/14 2126 06/01/14 0644      Assessment Principal Problem:   Complex, persistent left breast abscess during pregnancy, antepartum s/p incision and drainage 05/31/14-did well overnight.    LOS: 1 day   Plan: Okay for discharge today from my standpoint. Clindamycin 600 mg tid for one week.  Follow up with me in one week in the office.     Brandy Kabat  J 06/01/2014

## 2014-06-01 NOTE — Progress Notes (Signed)
Ur chart review completed.  

## 2014-06-01 NOTE — Discharge Summary (Signed)
Physician Discharge Summary  Patient ID: Samantha Sweeney MRN: [id number] DOB/AGE: 1981-02-03 33 y.o.  Admit date: 05/31/2014 Discharge date: 06/01/2014  Admission Diagnoses:L breast abscess at 36 weeks  Discharge Diagnoses: same Principal Problem:   Breast abscess during pregnancy, antepartum   Discharged Condition: good  Hospital Course: Uncomplicated I&D by surgery and uncomplicated post op course.  Consults: OB  Significant Diagnostic Studies: labs:   Results for orders placed during the hospital encounter of 05/31/14 (from the past 48 hour(s))  CBC     Status: Abnormal   Collection Time    05/31/14  4:49 PM      Result Value Ref Range   WBC 8.5  4.0 - 10.5 K/uL   RBC 4.43  3.87 - 5.11 MIL/uL   Hemoglobin 11.2 (*) 12.0 - 15.0 g/dL   HCT 33.7 (*) 36.0 - 46.0 %   MCV 76.1 (*) 78.0 - 100.0 fL   MCH 25.3 (*) 26.0 - 34.0 pg   MCHC 33.2  30.0 - 36.0 g/dL   RDW 14.0  11.5 - 15.5 %   Platelets 241  150 - 400 K/uL  WOUND CULTURE     Status: None   Collection Time    05/31/14  5:39 PM      Result Value Ref Range   Specimen Description BREAST LEFT     Special Requests NONE     Gram Stain       Value: RARE WBC PRESENT,BOTH PMN AND MONONUCLEAR     NO SQUAMOUS EPITHELIAL CELLS SEEN     NO ORGANISMS SEEN     Performed at [facility]   Culture       Value: NO GROWTH 1 DAY     Performed at [facility]   Report Status PENDING      Treatments: surgery: I&D of abscess  Discharge Exam: Blood pressure 102/51, pulse 74, temperature 98.4 F (36.9 C), temperature source Oral, resp. rate 18, height 5\' 5"  (1.651 m), weight 88.905 kg (196 lb), SpO2 98.00%.   Disposition: 01-Home or Self Care  Discharge Instructions   Discharge activity:  Up to eat    Complete by:  As directed      Discharge diet:  No restrictions    Complete by:  As directed      Discharge instructions    Complete by:  As directed   Modified bedrest.  Refrain from intercourse.  Count baby's  movements in 1 hour per day- if you don't get 6 in that hour, call.     Do not have sex or do anything that might make you have an orgasm    Complete by:  As directed      Fetal Kick Count:  Lie on our left side for one hour after a meal, and count the number of times your baby kicks.  If it is less than 5 times, get up, move around and drink some juice.  Repeat the test 30 minutes later.  If it is still less than 5 kicks in an hour, notify your doctor.    Complete by:  As directed      LABOR:  When conractions begin, you should start to time them from the beginning of one contraction to the beginning  of the next.  When contractions are 5 - 10 minutes apart or less and have been regular for at least an hour, you should call your health care provider.    Complete by:  As directed  Notify physician for bleeding from the vagina    Complete by:  As directed      Notify physician for blurring of vision or spots before the eyes    Complete by:  As directed      Notify physician for chills or fever    Complete by:  As directed      Notify physician for fainting spells, "black outs" or loss of consciousness    Complete by:  As directed      Notify physician for increase in vaginal discharge    Complete by:  As directed      Notify physician for leaking of fluid    Complete by:  As directed      Notify physician for pain or burning when urinating    Complete by:  As directed      Notify physician for pelvic pressure (sudden increase)    Complete by:  As directed      Notify physician for severe or continued nausea or vomiting    Complete by:  As directed      Notify physician for sudden gushing of fluid from the vagina (with or without continued leaking)    Complete by:  As directed      Notify physician for sudden, constant, or occasional abdominal pain    Complete by:  As directed      Notify physician if baby moving less than usual    Complete by:  As directed              Medication List         clindamycin 300 MG capsule  Commonly known as:  CLEOCIN  Take 2 capsules (600 mg total) by mouth 3 (three) times daily.     HYDROcodone-acetaminophen 5-325 MG per tablet  Commonly known as:  NORCO/VICODIN  Take 1 tablet by mouth every 4 (four) hours as needed for moderate pain.     MULTI VITAMIN DAILY PO  Take by mouth.           Follow-up Information   Follow up with ROSENBOWER,TODD J, MD In 1 week.   Specialty:  General Surgery   Contact information:   [address] [place] [address] [phone]       Signed: Daria Pastures 06/01/2014, 8:05 AM

## 2014-06-01 NOTE — Anesthesia Postprocedure Evaluation (Signed)
  Anesthesia Post-op Note  Patient: Control and instrumentation engineer  Procedure(s) Performed: Procedure(s) with comments: CYST EXCISION BREAST (N/A) - complex incision, drainage, debridement of left breast abcesses  Patient Location: Antenatal  Anesthesia Type:General  Level of Consciousness: awake, alert  and oriented  Airway and Oxygen Therapy: Patient Spontanous Breathing  Post-op Pain: none  Post-op Assessment: Post-op Vital signs reviewed, Patient's Cardiovascular Status Stable, Respiratory Function Stable and Pain level controlled  Post-op Vital Signs: Reviewed and stable  Last Vitals:  Filed Vitals:   06/01/14 0808  BP: 103/52  Pulse: 78  Temp: 36.7 C  Resp: 20    Complications: No apparent anesthesia complications

## 2014-06-02 ENCOUNTER — Telehealth (INDEPENDENT_AMBULATORY_CARE_PROVIDER_SITE_OTHER): Payer: Self-pay

## 2014-06-02 ENCOUNTER — Encounter (HOSPITAL_COMMUNITY): Payer: Self-pay | Admitting: General Surgery

## 2014-06-02 NOTE — Telephone Encounter (Signed)
She had complex incision and drainage of two breast abscesses and is [redacted] weeks pregnant.  Agree with recommendations.

## 2014-06-02 NOTE — Telephone Encounter (Signed)
Pt s/p cyst excision on 8/18 by Dr Zella Richer. Pt is calling into c/o body aches. Pt denies any fevers, chills, n/v. Pt states that her breast has not hurt at all. Incision looks good, no redness,swelling, tenderness. Advised pt that she can take Ibuprofen if needed and she can place heat on her back to help with her backache. Informed pt that I would send Dr Zella Richer a message for any further recommendations. Advised the pt to call us back if she develops any fevers, chills, redness, swelling odors or tenderness around the incision.  Pt verbalized understanding. Please advise.

## 2014-06-03 LAB — WOUND CULTURE: Culture: NO GROWTH

## 2014-06-06 ENCOUNTER — Encounter (INDEPENDENT_AMBULATORY_CARE_PROVIDER_SITE_OTHER): Payer: Managed Care, Other (non HMO) | Admitting: General Surgery

## 2014-06-08 ENCOUNTER — Ambulatory Visit (INDEPENDENT_AMBULATORY_CARE_PROVIDER_SITE_OTHER): Payer: Managed Care, Other (non HMO) | Admitting: General Surgery

## 2014-06-08 VITALS — BP 106/70 | HR 79 | Temp 98.1°F | Ht 65.0 in | Wt 196.5 lb

## 2014-06-08 DIAGNOSIS — Z4889 Encounter for other specified surgical aftercare: Secondary | ICD-10-CM

## 2014-06-08 NOTE — Patient Instructions (Signed)
Continue current wound care

## 2014-06-08 NOTE — Progress Notes (Signed)
Procedure:  Complex incision and drainage of left breast abscess  Date:  05/31/14  Pathology:  Culture demonstrated no growth  History:  She is here for her first postoperative visit. She has not been having any problems. She's been changing the dressing daily.  Exam: General- Is in NAD. Left breast-the breasts is much softer. The wound remains open with 2 Penrose drains in. These were pulled back slightly then reattached to the safety pin. A bulky dressing was applied.  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 Drains had been pulled back slightly.  Plan:  Continue current wound care. Return visit one week.

## 2014-06-10 ENCOUNTER — Encounter (INDEPENDENT_AMBULATORY_CARE_PROVIDER_SITE_OTHER): Payer: Managed Care, Other (non HMO) | Admitting: Surgery

## 2014-06-15 ENCOUNTER — Encounter (INDEPENDENT_AMBULATORY_CARE_PROVIDER_SITE_OTHER): Payer: Managed Care, Other (non HMO) | Admitting: General Surgery

## 2014-06-22 ENCOUNTER — Encounter (INDEPENDENT_AMBULATORY_CARE_PROVIDER_SITE_OTHER): Payer: Managed Care, Other (non HMO) | Admitting: Surgery

## 2014-06-28 ENCOUNTER — Encounter (HOSPITAL_COMMUNITY): Payer: Self-pay

## 2014-06-28 ENCOUNTER — Inpatient Hospital Stay (HOSPITAL_COMMUNITY)
Admission: AD | Admit: 2014-06-28 | Discharge: 2014-06-29 | DRG: 775 | Disposition: A | Discharge status: Home / Self Care | Payer: Managed Care, Other (non HMO) | Source: Ambulatory Visit | Attending: Obstetrics | Admitting: Obstetrics

## 2014-06-28 DIAGNOSIS — O479 False labor, unspecified: Secondary | ICD-10-CM | POA: Diagnosis present

## 2014-06-28 LAB — CBC
HCT: 33.4 % — ABNORMAL LOW (ref 36.0–46.0)
Hemoglobin: 11.2 g/dL — ABNORMAL LOW (ref 12.0–15.0)
MCH: 25.2 pg — AB (ref 26.0–34.0)
MCHC: 33.5 g/dL (ref 30.0–36.0)
MCV: 75.1 fL — ABNORMAL LOW (ref 78.0–100.0)
Platelets: 188 10*3/uL (ref 150–400)
RBC: 4.45 MIL/uL (ref 3.87–5.11)
RDW: 14.7 % (ref 11.5–15.5)
WBC: 6.6 10*3/uL (ref 4.0–10.5)

## 2014-06-28 LAB — TYPE AND SCREEN
ABO/RH(D): O POS
Antibody Screen: NEGATIVE

## 2014-06-28 LAB — RPR

## 2014-06-28 MED ORDER — WITCH HAZEL-GLYCERIN EX PADS: 1.0000 "application " | MEDICATED_PAD | CUTANEOUS | Status: DC | PRN

## 2014-06-28 MED ORDER — LANOLIN HYDROUS EX OINT: TOPICAL_OINTMENT | CUTANEOUS | Status: DC | PRN

## 2014-06-28 MED ORDER — ONDANSETRON HCL 4 MG PO TABS: 4.0000 mg | ORAL_TABLET | ORAL | Status: DC | PRN

## 2014-06-28 MED ORDER — ONDANSETRON HCL 4 MG/2ML IJ SOLN: 4.0000 mg | Freq: Four times a day (QID) | INTRAMUSCULAR | Status: DC | PRN

## 2014-06-28 MED ORDER — OXYCODONE-ACETAMINOPHEN 5-325 MG PO TABS: 2.0000 | ORAL_TABLET | ORAL | Status: DC | PRN

## 2014-06-28 MED ORDER — ONDANSETRON HCL 4 MG/2ML IJ SOLN: 4.0000 mg | INTRAMUSCULAR | Status: DC | PRN

## 2014-06-28 MED ORDER — PRENATAL MULTIVITAMIN CH
1.0000 | ORAL_TABLET | Freq: Every day | ORAL | Status: DC
  Administered 2014-06-28 – 2014-06-29 (×2): 1 via ORAL
  Filled 2014-06-28 (×2): qty 1

## 2014-06-28 MED ORDER — LIDOCAINE HCL (PF) 1 % IJ SOLN
30.0000 mL | INTRAMUSCULAR | Status: DC | PRN
  Filled 2014-06-28: qty 30

## 2014-06-28 MED ORDER — OXYCODONE-ACETAMINOPHEN 5-325 MG PO TABS: 1.0000 | ORAL_TABLET | ORAL | Status: DC | PRN

## 2014-06-28 MED ORDER — SIMETHICONE 80 MG PO CHEW: 80.0000 mg | CHEWABLE_TABLET | ORAL | Status: DC | PRN

## 2014-06-28 MED ORDER — ZOLPIDEM TARTRATE 5 MG PO TABS: 5.0000 mg | ORAL_TABLET | Freq: Every evening | ORAL | Status: DC | PRN

## 2014-06-28 MED ORDER — OXYTOCIN 40 UNITS IN LACTATED RINGERS INFUSION - SIMPLE MED: 62.5000 mL/h | INTRAVENOUS | Status: DC

## 2014-06-28 MED ORDER — IBUPROFEN 600 MG PO TABS
600.0000 mg | ORAL_TABLET | Freq: Four times a day (QID) | ORAL | Status: DC
  Administered 2014-06-28 – 2014-06-29 (×5): 600 mg via ORAL
  Filled 2014-06-28 (×5): qty 1

## 2014-06-28 MED ORDER — LIDOCAINE HCL (PF) 1 % IJ SOLN
INTRAMUSCULAR | Status: AC
  Filled 2014-06-28: qty 30

## 2014-06-28 MED ORDER — OXYTOCIN 40 UNITS IN LACTATED RINGERS INFUSION - SIMPLE MED
INTRAVENOUS | Status: AC
  Filled 2014-06-28: qty 1000

## 2014-06-28 MED ORDER — LACTATED RINGERS IV SOLN: 500.0000 mL | INTRAVENOUS | Status: DC | PRN

## 2014-06-28 MED ORDER — LACTATED RINGERS IV SOLN: INTRAVENOUS | Status: DC

## 2014-06-28 MED ORDER — CITRIC ACID-SODIUM CITRATE 334-500 MG/5ML PO SOLN: 30.0000 mL | ORAL | Status: DC | PRN

## 2014-06-28 MED ORDER — TETANUS-DIPHTH-ACELL PERTUSSIS 5-2.5-18.5 LF-MCG/0.5 IM SUSP: 0.5000 mL | Freq: Once | INTRAMUSCULAR | Status: DC

## 2014-06-28 MED ORDER — SENNOSIDES-DOCUSATE SODIUM 8.6-50 MG PO TABS
2.0000 | ORAL_TABLET | ORAL | Status: DC
  Administered 2014-06-28: 2 via ORAL
  Filled 2014-06-28: qty 2

## 2014-06-28 MED ORDER — FLEET ENEMA 7-19 GM/118ML RE ENEM: 1.0000 | ENEMA | RECTAL | Status: DC | PRN

## 2014-06-28 MED ORDER — BENZOCAINE-MENTHOL 20-0.5 % EX AERO
1.0000 "application " | INHALATION_SPRAY | CUTANEOUS | Status: DC | PRN
  Filled 2014-06-28: qty 56

## 2014-06-28 MED ORDER — DIPHENHYDRAMINE HCL 25 MG PO CAPS: 25.0000 mg | ORAL_CAPSULE | Freq: Four times a day (QID) | ORAL | Status: DC | PRN

## 2014-06-28 MED ORDER — DIBUCAINE 1 % RE OINT: 1.0000 "application " | TOPICAL_OINTMENT | RECTAL | Status: DC | PRN

## 2014-06-28 MED ORDER — OXYTOCIN BOLUS FROM INFUSION
500.0000 mL | INTRAVENOUS | Status: DC
  Administered 2014-06-28: 500 mL via INTRAVENOUS

## 2014-06-28 MED ORDER — ACETAMINOPHEN 325 MG PO TABS: 650.0000 mg | ORAL_TABLET | ORAL | Status: DC | PRN

## 2014-06-28 NOTE — Lactation Note (Signed)
This note was copied from the chart of [place]. Lactation Consultation Note  P2, Ex BF.  Breastfed son for 2 years. Baby sleeping. Older son weaned at 2 years.  She had history of mastitis and persistent left breast abscess. At approximately 26 weeks of pregnancy she had a persistent left breast abscess that has failed simple incision and drainages in the office and 3 rounds of antibiotics.  At 36 weeks a follow up ultrasound demonstrated a 9 cm abscess at the 11 to 12:00 position of the left breast approximately 8 cm from the nipple. The left 12:00 position retroareolar was a second abscess measuring approximately 2.7 cm. 8/18 she was admitted for a more complex left breast I&D (reopened incisions) with drains. According to H&P Cultures were negative.  Currently incision is healing.  Clean dry intact, no drainage.  New tissue visible.   Left nipple is inverted.  Mother states right nipple was originally inverted but everted with breastfeeding. Mother states older child breastfed well on both breasts. Reviewed hand expression.  Right breast good flow of colostrum, left breast glistening of colostrum visible. Demonstrated how to hand pump to evert left nipple with minimal eversion. DEBP everted nipple substantially. Plan is for mother to breastfeed on left breast. If she is unable to latch on left breast, she will pump left breast for 15-20 min massaging her breast as she breastfeeds or pumps. DEBP cleaning and milk storage reviewed and foley cup reviewed. Mom encouraged to feed baby 8-12 times/24 hours and with feeding cues.  Mom made aware of O/P services, breastfeeding support groups, community resources, and our phone # for post-discharge questions.  Encouraged mother to call for assistance with breastfeeding if needed.     Patient Name: Boy Faithlynn Deeley [id number] Date: 06/28/2014     Maternal Data    Feeding Feeding Type: Breast Fed Length of feed: 20 min  LATCH  Score/Interventions                      Lactation Tools Discussed/Used     Consult Status      Carlye Grippe 06/28/2014, 1:52 PM

## 2014-06-28 NOTE — H&P (Signed)
33 y.o. [redacted]w[redacted]d  G2P1001 presents with painful contractions.  On arrival to [facility]U, she was 9 cm and SROM'ed.  Otherwise has good fetal movement and no bleeding.  History reviewed. No pertinent past medical history.  Past Surgical History  Procedure Laterality Date  . Wisdom tooth extraction  2005  . Breast cyst excision N/A 05/31/2014    Procedure: CYST EXCISION BREAST;  Surgeon: Odis Hollingshead, MD;  Location: [facility];  Service: General;  Laterality: N/A;  complex incision, drainage, debridement of left breast abcesses    OB History  Gravida Para Term Preterm AB SAB TAB Ectopic Multiple Living  2 1 1       1     # Outcome Date GA Lbr Len/2nd Weight Sex Delivery Anes PTL Lv  2 CUR           1 TRM 01/22/11 [redacted]w[redacted]d 07:00 3.147 kg (6 lb 15 oz) M SVD EPI N Y      History   Social History  . Marital Status: Married    Spouse Name: N/A    Number of Children: N/A  . Years of Education: N/A   Occupational History  . Not on file.   Social History Main Topics  . Smoking status: Never Smoker   . Smokeless tobacco: Not on file  . Alcohol Use: No     Comment: ocassional before the pregnancy  . Drug Use: No  . Sexual Activity: Yes    Birth Control/ Protection: None   Other Topics Concern  . Not on file   Social History Narrative  . No narrative on file   Review of patient's allergies indicates no known allergies.    Prenatal Transfer Tool  Maternal Diabetes: No Genetic Screening: Declined Maternal Ultrasounds/Referrals: Normal Fetal Ultrasounds or other Referrals:  None Maternal Substance Abuse:  No Significant Maternal Medications:  None Significant Maternal Lab Results: Lab values include: Group B Strep negative  Other PNC: I&D of left breast abscess at 37 weeks.    Filed Vitals:   06/28/14 0428  BP: 147/79  Pulse: 70  Temp:   Resp:      General:  NAD Lungs: CTAB Cardiac: RRR Abdomen:  soft, gravid Ex:  no edema SVE:  Delivered on my arrival FHTs:  130s, mod var  in MAU prior to delivery    A/P   33 y.o. [redacted]w[redacted]d  G2P1001 presents with labor, SROM  FSR/ vtx/ GBS neg  Uncomplicated delivery.  To PP for routine care.   Brandon, [facility]

## 2014-06-28 NOTE — Progress Notes (Signed)
FOB ask to let mom sleep for a little  while

## 2014-06-29 ENCOUNTER — Ambulatory Visit: Payer: Self-pay

## 2014-06-29 LAB — CBC
HCT: 27.8 % — ABNORMAL LOW (ref 36.0–46.0)
HEMOGLOBIN: 9 g/dL — AB (ref 12.0–15.0)
MCH: 24.5 pg — ABNORMAL LOW (ref 26.0–34.0)
MCHC: 32.4 g/dL (ref 30.0–36.0)
MCV: 75.5 fL — ABNORMAL LOW (ref 78.0–100.0)
PLATELETS: 212 10*3/uL (ref 150–400)
RBC: 3.68 MIL/uL — AB (ref 3.87–5.11)
RDW: 14.9 % (ref 11.5–15.5)
WBC: 7.5 10*3/uL (ref 4.0–10.5)

## 2014-06-29 NOTE — Progress Notes (Signed)
Pt and baby are doing well. Baby had circ this am. Lochia-mild IMP/ stable Plan/ routine care.

## 2014-06-29 NOTE — Discharge Summary (Signed)
Obstetric Discharge Summary Reason for Admission: onset of labor Prenatal Procedures: ultrasound Intrapartum Procedures: spontaneous vaginal delivery Postpartum Procedures: none Complications-Operative and Postpartum: 1st degree perineal laceration Hemoglobin  Date Value Ref Range Status  06/29/2014 9.0* 12.0 - 15.0 g/dL Final     DELTA CHECK NOTED     REPEATED TO VERIFY  01/05/2014 11.1   Final     HCT  Date Value Ref Range Status  06/29/2014 27.8* 36.0 - 46.0 % Final  01/05/2014 37   Final    Physical Exam:  General: alert Lochia: appropriate Uterine Fundus: firm   Discharge Diagnoses: Term Pregnancy-delivered  Discharge Information: Date: 06/29/2014 Activity: pelvic rest Diet: routine Medications: PNV and Ibuprofen Condition: stable Instructions: refer to practice specific booklet Discharge to: home Follow-up Information   Follow up with Jerelyn Charles, MD. Schedule an appointment as soon as possible for a visit in 1 month.   Specialty:  Obstetrics   Contact information:   [place] [address] [phone]       Newborn Data: Live born female  Birth Weight: 7 lb 5.8 oz (3340 g) AP  ANDERSON,MARK E 06/29/2014, 11:55 AM

## 2014-06-29 NOTE — Lactation Note (Addendum)
This note was copied from the chart of [place]. Lactation Consultation Note  Patient Name: Samantha Sweeney [id number] Date: 06/29/2014 Reason for consult: Follow-up assessment Per mom baby was circ'd this am and has been sleepy , @ consult placed the baby skin to skin and the baby latched for 5 mins  And fell asleep , noted a few swallows. Steady flow of colostrum - to  transitional milk noted after baby released. LC reviewed sore nipple and engorgement prevention and tx , per mom will have a DEBP when she goes home.  Due to left breast being S/P abscess and excision in the last mouth and the areola being tough, for now mom plans to pump with an electric  Pump on the left for stimulation and to prevent engorgement and protect milk supply.2 #30 flanges given to mom incase she need to increase when milk comes if needed. Per  Mom will be calling the general surgeon ( as instructed by him ) due to leakage of milk from the excised area.  Mom plans to call to set up and [facility] O/P apt. Mother informed of post-discharge support and given phone number to the lactation department, including services for phone call assistance;  out-patient appointments; and breastfeeding support group. List of other breastfeeding resources in the community given in the handout.  Encouraged mother to call for problems or concerns related to breastfeeding.   Maternal Data Has patient been taught Hand Expression?: Yes  Feeding Feeding Type: Breast Fed Length of feed: 5 min (deep latch with swallows , sleepy from circ )  LATCH Score/Interventions Latch: Grasps breast easily, tongue down, lips flanged, rhythmical sucking.  Audible Swallowing: A few with stimulation  Type of Nipple: Everted at rest and after stimulation  Comfort (Breast/Nipple): Filling, red/small blisters or bruises, mild/mod discomfort  Problem noted: Filling  Hold (Positioning): No assistance needed to correctly position infant at  breast. Intervention(s): Breastfeeding basics reviewed;Support Pillows;Position options;Skin to skin  LATCH Score: 8  Lactation Tools Discussed/Used Tools: Pump;Comfort gels;Flanges Flange Size: 30 (if needed for when the milk comes in ) Breast pump type: Double-Electric Breast Pump   Consult Status Consult Status: Complete Date: 06/29/14 Follow-up type: In-patient    Myer Haff 06/29/2014, 2:42 PM

## 2014-06-30 ENCOUNTER — Encounter (INDEPENDENT_AMBULATORY_CARE_PROVIDER_SITE_OTHER): Payer: Managed Care, Other (non HMO) | Admitting: Surgery

## 2014-08-15 ENCOUNTER — Encounter (HOSPITAL_COMMUNITY): Payer: Self-pay

## 2017-10-14 HISTORY — PX: BREAST SURGERY: SHX581

## 2017-10-14 HISTORY — PX: BREAST EXCISIONAL BIOPSY: SUR124

## 2019-10-15 HISTORY — PX: BREAST EXCISIONAL BIOPSY: SUR124

## 2019-11-18 ENCOUNTER — Other Ambulatory Visit: Payer: Self-pay | Admitting: Obstetrics and Gynecology

## 2019-11-18 DIAGNOSIS — N6320 Unspecified lump in the left breast, unspecified quadrant: Secondary | ICD-10-CM

## 2019-11-19 ENCOUNTER — Other Ambulatory Visit (HOSPITAL_COMMUNITY): Payer: Self-pay | Admitting: *Deleted

## 2019-11-19 DIAGNOSIS — N644 Mastodynia: Secondary | ICD-10-CM

## 2019-11-19 DIAGNOSIS — N6320 Unspecified lump in the left breast, unspecified quadrant: Secondary | ICD-10-CM

## 2019-11-22 ENCOUNTER — Other Ambulatory Visit (HOSPITAL_COMMUNITY): Payer: Self-pay | Admitting: Obstetrics and Gynecology

## 2019-11-22 DIAGNOSIS — N644 Mastodynia: Secondary | ICD-10-CM

## 2019-11-22 DIAGNOSIS — N6320 Unspecified lump in the left breast, unspecified quadrant: Secondary | ICD-10-CM

## 2019-11-23 ENCOUNTER — Other Ambulatory Visit: Payer: Self-pay | Admitting: Student

## 2019-11-23 ENCOUNTER — Encounter (HOSPITAL_COMMUNITY): Payer: Self-pay

## 2019-11-23 ENCOUNTER — Ambulatory Visit
Admission: RE | Admit: 2019-11-23 | Discharge: 2019-11-23 | Disposition: A | Discharge status: Home / Self Care | Payer: Self-pay | Source: Ambulatory Visit | Attending: Obstetrics and Gynecology | Admitting: Obstetrics and Gynecology

## 2019-11-23 ENCOUNTER — Ambulatory Visit
Admission: RE | Admit: 2019-11-23 | Discharge: 2019-11-23 | Disposition: A | Discharge status: Home / Self Care | Payer: No Typology Code available for payment source | Source: Ambulatory Visit | Attending: Obstetrics and Gynecology | Admitting: Obstetrics and Gynecology

## 2019-11-23 ENCOUNTER — Observation Stay (HOSPITAL_COMMUNITY)
Admission: AD | Admit: 2019-11-23 | Discharge: 2019-11-24 | Disposition: A | Discharge status: Home / Self Care | Payer: No Typology Code available for payment source | Source: Ambulatory Visit | Attending: General Surgery | Admitting: General Surgery

## 2019-11-23 ENCOUNTER — Ambulatory Visit (HOSPITAL_COMMUNITY)
Admission: RE | Admit: 2019-11-23 | Discharge: 2019-11-23 | Disposition: A | Discharge status: Home / Self Care | Payer: Self-pay | Source: Ambulatory Visit | Attending: Obstetrics and Gynecology | Admitting: Obstetrics and Gynecology

## 2019-11-23 ENCOUNTER — Other Ambulatory Visit: Payer: Self-pay

## 2019-11-23 DIAGNOSIS — N6320 Unspecified lump in the left breast, unspecified quadrant: Secondary | ICD-10-CM

## 2019-11-23 DIAGNOSIS — Z20822 Contact with and (suspected) exposure to covid-19: Secondary | ICD-10-CM | POA: Insufficient documentation

## 2019-11-23 DIAGNOSIS — N644 Mastodynia: Secondary | ICD-10-CM

## 2019-11-23 DIAGNOSIS — N611 Abscess of the breast and nipple: Principal | ICD-10-CM | POA: Diagnosis present

## 2019-11-23 DIAGNOSIS — Z1239 Encounter for other screening for malignant neoplasm of breast: Secondary | ICD-10-CM | POA: Insufficient documentation

## 2019-11-23 DIAGNOSIS — Z1231 Encounter for screening mammogram for malignant neoplasm of breast: Secondary | ICD-10-CM | POA: Insufficient documentation

## 2019-11-23 DIAGNOSIS — Z803 Family history of malignant neoplasm of breast: Secondary | ICD-10-CM | POA: Insufficient documentation

## 2019-11-23 LAB — MRSA PCR SCREENING: MRSA by PCR: NEGATIVE

## 2019-11-23 MED ORDER — SODIUM CHLORIDE 0.9 % IV SOLN: INTRAVENOUS | Status: DC

## 2019-11-23 MED ORDER — OXYCODONE HCL 5 MG PO TABS
5.0000 mg | ORAL_TABLET | ORAL | Status: DC | PRN
  Administered 2019-11-24: 10 mg via ORAL
  Filled 2019-11-23: qty 2

## 2019-11-23 MED ORDER — ONDANSETRON 4 MG PO TBDP: 4.0000 mg | ORAL_TABLET | Freq: Four times a day (QID) | ORAL | Status: DC | PRN

## 2019-11-23 MED ORDER — ONDANSETRON HCL 4 MG/2ML IJ SOLN: 4.0000 mg | Freq: Four times a day (QID) | INTRAMUSCULAR | Status: DC | PRN

## 2019-11-23 MED ORDER — ACETAMINOPHEN 325 MG PO TABS: 650.0000 mg | ORAL_TABLET | Freq: Four times a day (QID) | ORAL | Status: DC | PRN

## 2019-11-23 MED ORDER — ACETAMINOPHEN 650 MG RE SUPP: 650.0000 mg | Freq: Four times a day (QID) | RECTAL | Status: DC | PRN

## 2019-11-23 MED ORDER — ENOXAPARIN SODIUM 40 MG/0.4ML ~~LOC~~ SOLN
40.0000 mg | SUBCUTANEOUS | Status: DC
  Administered 2019-11-23: 18:00:00 40 mg via SUBCUTANEOUS
  Filled 2019-11-23: qty 0.4

## 2019-11-23 MED ORDER — MORPHINE SULFATE (PF) 2 MG/ML IV SOLN: 1.0000 mg | INTRAVENOUS | Status: DC | PRN

## 2019-11-23 NOTE — H&P (View-Only) (Signed)
Samantha Sweeney Documented: 11/23/2019 3:40 PM Location: [facility] Patient #: [id number] DOB: April 30, 1981 Single / Language: Cleophus Molt / Race: Black or African American Female  History of Present Illness  The patient is a 39 year old female who presents with a breast abscess. She is presenting to the office per request of the [facility]. In 2015, Dr. Zella Richer incised and drained chronic left breast abscesses in the OR. 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 In 2019, she underwent I&D of lower left breast abscess in the OR when she was living in [place].   Since then, she has not had any issues in her left breast until about 2 weeks ago. She completed a ten-day course of clindamycin with no improvement, so she underwent mammogram and ultrasound this morning. I spoke to Dr. Enriqueta Shutter on the phone who describes seeing a 10 cm x 10 cm multiloculated lower left breast abscess. He attempted aspiration with a 12-gauge needle but was only able to aspirate 5 cc of purulent drainage from the largest component of the loculated area. She states the area is very tender. She denies any drainage. Denies fevers.  Allergies No Known Drug Allergies  Medication History No Current Medications  Review of Systems General Not Present- Appetite Loss, Chills, Fatigue, Fever, Night Sweats, Weight Gain and Weight Loss. Skin Not Present- Change in Wart/Mole, Dryness, Hives, Jaundice, New Lesions, Non-Healing Wounds, Rash and Ulcer. HEENT Not Present- Earache, Hearing Loss, Hoarseness, Nose Bleed, Oral Ulcers, Ringing in the Ears, Seasonal Allergies, Sinus Pain, Sore Throat, Visual Disturbances, Wears glasses/contact lenses and Yellow Eyes. Respiratory Not Present- Bloody sputum, Chronic Cough, Difficulty Breathing, Snoring and Wheezing. Breast Present- Breast Mass and Skin Changes. Not Present- Breast Pain  and Nipple Discharge. Cardiovascular Not Present- Chest Pain, Difficulty Breathing Lying Down, Leg Cramps, Palpitations, Rapid Heart Rate, Shortness of Breath and Swelling of Extremities. Gastrointestinal Not Present- Abdominal Pain, Bloating, Bloody Stool, Change in Bowel Habits, Chronic diarrhea, Constipation, Difficulty Swallowing, Excessive gas, Gets full quickly at meals, Hemorrhoids, Indigestion, Nausea, Rectal Pain and Vomiting. Female Genitourinary Not Present- Frequency, Nocturia, Painful Urination, Pelvic Pain and Urgency. Musculoskeletal Not Present- Back Pain, Joint Pain, Joint Stiffness, Muscle Pain, Muscle Weakness and Swelling of Extremities. Neurological Not Present- Decreased Memory, Fainting, Headaches, Numbness, Seizures, Tingling, Tremor, Trouble walking and Weakness. Psychiatric Not Present- Anxiety, Bipolar, Change in Sleep Pattern, Depression, Fearful and Frequent crying. Endocrine Not Present- Cold Intolerance, Excessive Hunger, Hair Changes, Heat Intolerance, Hot flashes and New Diabetes. Hematology Not Present- Easy Bruising, Excessive bleeding, Gland problems, HIV and Persistent Infections.  Vitals 11/23/2019 3:45 PM Weight: 200.2 lb Height: 65in Body Surface Area: 1.98 m Body Mass Index: 33.31 kg/m  Temp.: 96.37F(Tympanic)  Pulse: 106 (Regular)  BP: 126/72 (Sitting, Left Arm, Standard)  Physical Exam  GENERAL: Well-developed, well nourished female in no acute distress Wearing mask  EYES: No scleral icterus Pupils equal, lids normal  RESPIRATORY: Normal effort, no use of accessory muscles  MUSCULOSKELETAL: Normal gait Grossly normal ROM upper extremities Grossly normal ROM lower extremities  SKIN: Warm and dry Not diaphoretic  PSYCHIATRIC: Normal judgement and insight Normal mood and affect Alert, oriented x 3  Breast: Left breast is large and pendulous The majority of the lower breast has a firm mass consistent with abscess There  is a stellate scar in the upper breast from surgery in 2015 There are 2 scars on the  lower breast from surgery in 2019 >> Aspiration was attempted from the largest component based on ultrasound, from the lateral scar on the lower breast There is no overlying cellulitis and the entire area is tender to palpation  Assessment & Plan BREAST ABSCESS OF FEMALE (N61.1) She is presenting to urgent office per request of Dr. Enriqueta Shutter at the [facility]  Aspiration of a large multiloculated left breast abscess was attempted but was not successful.  On exam, the majority of the lower left breast is affected by the abscess. Given her history and exam findings, I asked Dr. Barry Dienes to also evaluate the patient. She believes bedside I&D would not adequately drain the entire abscess and the patient may not tolerate the procedure. Therefore, she recommends I&D in the OR. Dr. Donne Hazel has agreed to see her.  She will be admitted to [facility], NPO after midnight, and posted for tomorrow morning after covid testing is back.  Carlena Hurl, [facility]

## 2019-11-23 NOTE — H&P (Signed)
Samantha Sweeney Documented: 11/23/2019 3:40 PM Location: [facility] Patient #: [phone] DOB: March 24, 1981 Single / Language: Samantha Sweeney / Race: Black or African American Female  History of Present Illness  The patient is a 39 year old female who presents with a breast abscess. She is presenting to the office per request of the [place]. In 2015, Dr. Zella Richer incised and drained chronic left breast abscesses in the OR. 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 In 2019, she underwent I&D of lower left breast abscess in the OR when she was living in [place].   Since then, she has not had any issues in her left breast until about 2 weeks ago. She completed a ten-day course of clindamycin with no improvement, so she underwent mammogram and ultrasound this morning. I spoke to Dr. Enriqueta Shutter on the phone who describes seeing a 10 cm x 10 cm multiloculated lower left breast abscess. He attempted aspiration with a 12-gauge needle but was only able to aspirate 5 cc of purulent drainage from the largest component of the loculated area. She states the area is very tender. She denies any drainage. Denies fevers.  Allergies No Known Drug Allergies  Medication History No Current Medications  Review of Systems General Not Present- Appetite Loss, Chills, Fatigue, Fever, Night Sweats, Weight Gain and Weight Loss. Skin Not Present- Change in Wart/Mole, Dryness, Hives, Jaundice, New Lesions, Non-Healing Wounds, Rash and Ulcer. HEENT Not Present- Earache, Hearing Loss, Hoarseness, Nose Bleed, Oral Ulcers, Ringing in the Ears, Seasonal Allergies, Sinus Pain, Sore Throat, Visual Disturbances, Wears glasses/contact lenses and Yellow Eyes. Respiratory Not Present- Bloody sputum, Chronic Cough, Difficulty Breathing, Snoring and Wheezing. Breast Present- Breast Mass and Skin Changes. Not Present- Breast Pain  and Nipple Discharge. Cardiovascular Not Present- Chest Pain, Difficulty Breathing Lying Down, Leg Cramps, Palpitations, Rapid Heart Rate, Shortness of Breath and Swelling of Extremities. Gastrointestinal Not Present- Abdominal Pain, Bloating, Bloody Stool, Change in Bowel Habits, Chronic diarrhea, Constipation, Difficulty Swallowing, Excessive gas, Gets full quickly at meals, Hemorrhoids, Indigestion, Nausea, Rectal Pain and Vomiting. Female Genitourinary Not Present- Frequency, Nocturia, Painful Urination, Pelvic Pain and Urgency. Musculoskeletal Not Present- Back Pain, Joint Pain, Joint Stiffness, Muscle Pain, Muscle Weakness and Swelling of Extremities. Neurological Not Present- Decreased Memory, Fainting, Headaches, Numbness, Seizures, Tingling, Tremor, Trouble walking and Weakness. Psychiatric Not Present- Anxiety, Bipolar, Change in Sleep Pattern, Depression, Fearful and Frequent crying. Endocrine Not Present- Cold Intolerance, Excessive Hunger, Hair Changes, Heat Intolerance, Hot flashes and New Diabetes. Hematology Not Present- Easy Bruising, Excessive bleeding, Gland problems, HIV and Persistent Infections.  Vitals 11/23/2019 3:45 PM Weight: 200.2 lb Height: 65in Body Surface Area: 1.98 m Body Mass Index: 33.31 kg/m  Temp.: 96.56F(Tympanic)  Pulse: 106 (Regular)  BP: 126/72 (Sitting, Left Arm, Standard)  Physical Exam  GENERAL: Well-developed, well nourished female in no acute distress Wearing mask  EYES: No scleral icterus Pupils equal, lids normal  RESPIRATORY: Normal effort, no use of accessory muscles  MUSCULOSKELETAL: Normal gait Grossly normal ROM upper extremities Grossly normal ROM lower extremities  SKIN: Warm and dry Not diaphoretic  PSYCHIATRIC: Normal judgement and insight Normal mood and affect Alert, oriented x 3  Breast: Left breast is large and pendulous The majority of the lower breast has a firm mass consistent with abscess There  is a stellate scar in the upper breast from surgery in 2015 There are 2 scars on the  lower breast from surgery in 2019 >> Aspiration was attempted from the largest component based on ultrasound, from the lateral scar on the lower breast There is no overlying cellulitis and the entire area is tender to palpation  Assessment & Plan BREAST ABSCESS OF FEMALE (N61.1) She is presenting to urgent office per request of Dr. Enriqueta Shutter at the [facility].  Aspiration of a large multiloculated left breast abscess was attempted but was not successful.  On exam, the majority of the lower left breast is affected by the abscess. Given her history and exam findings, I asked Dr. Barry Dienes to also evaluate the patient. She believes bedside I&D would not adequately drain the entire abscess and the patient may not tolerate the procedure. Therefore, she recommends I&D in the OR. Dr. Donne Hazel has agreed to see her.  She will be admitted to [facility], NPO after midnight, and posted for tomorrow morning after covid testing is back.  Carlena Hurl, [facility]

## 2019-11-23 NOTE — Progress Notes (Signed)
Complaints of painful left breast lump x 2 weeks that has increased in size. Patient states the pain is constant. Patient rates the pain at a 5 out of 10.  Pap Smear: Pap smear not completed today. Last Pap smear was 11/17/2019 per patient at [facility] and patient stated is awaiting results. Per patient has a history of an abnormal Pap smear 10 years ago that a repeat Pap smear was completed for follow-up that was normal. Patient stated she has had at least three normal Pap smears since repeat Pap smear. Last Pap smear result is not in Epic. Previous Pap smear result from 02/21/2011 is in [place].  Physical exam: Breasts Right breast is larger than left breast that per patient changed in 2015 when she had her first breast surgery. No skin abnormalities right breast. Three scars observed left breast x 2 left lower breast from surgery in 2019 and left upper breast from surgery in 2015. No nipple retraction right breast. Left nipple inverted that per patient first noticed in 2015. No nipple discharge bilateral breasts. No lymphadenopathy. No lumps palpated right breast. Palpated a lump within the left lower breast between 4 o'clock and 7 o'clock under the nipple. Complaints of tenderness when palpated left breast lump. Referred patient to the [facility] for a diagnostic mammogram and left breast ultrasound. Appointment scheduled for Thursday, November 24, 2018 at 1240.        Pelvic/Bimanual No Pap smear completed today since last Pap smear was 11/17/2019 per patient. Pap smear not indicated per BCCCP guidelines.   Smoking History: Patient has never smoked.  Patient Navigation: Patient education provided. Access to services provided for patient through BCCCP program.   Breast and Cervical Cancer Risk Assessment: Patient has a family history of her mother and three maternal great aunts having breast cancer. Patient has no known genetic mutations or radiation treatment to the chest  before age 29. Patient has no history of cervical dysplasia, immunocompromised, or DES exposure in-utero.  Risk Assessment    Risk Scores      11/23/2019   Last edited by: Demetrius Revel, LPN   5-year risk: 0.7 %   Lifetime risk: 15.1 %

## 2019-11-23 NOTE — Progress Notes (Signed)
Received patient as a direct admit from [facility]. Patient alert and oriented x4. Dr. Donne Hazel paged for orders.

## 2019-11-23 NOTE — Patient Instructions (Signed)
Explained breast self awareness with Samantha Sweeney. Patient did not need a Pap smear today due to last Pap smear was 11/17/2019 per patient. Let her know BCCCP will cover Pap smears every 3 years unless has a history of abnormal Pap smears. Referred patient to the [facility] for a diagnostic mammogram and left breast ultrasound. Appointment scheduled for Thursday, November 24, 2018 at 1240. Patient aware of appointment and will be there. Samantha Sweeney verbalized understanding.  Samantha Sweeney, Arvil Chaco, RN 10:52 AM

## 2019-11-24 ENCOUNTER — Other Ambulatory Visit: Payer: Self-pay

## 2019-11-24 ENCOUNTER — Observation Stay (HOSPITAL_COMMUNITY): Payer: No Typology Code available for payment source | Admitting: Anesthesiology

## 2019-11-24 ENCOUNTER — Encounter (HOSPITAL_COMMUNITY): Payer: Self-pay

## 2019-11-24 ENCOUNTER — Encounter (HOSPITAL_COMMUNITY): Admission: AD | Disposition: A | Discharge status: Home / Self Care | Payer: Self-pay | Source: Ambulatory Visit

## 2019-11-24 HISTORY — PX: IRRIGATION AND DEBRIDEMENT ABSCESS: SHX5252

## 2019-11-24 LAB — SARS CORONAVIRUS 2 (TAT 6-24 HRS): SARS Coronavirus 2: NEGATIVE

## 2019-11-24 LAB — PREGNANCY, URINE: Preg Test, Ur: NEGATIVE

## 2019-11-24 SURGERY — IRRIGATION AND DEBRIDEMENT ABSCESS
Anesthesia: General | Site: Breast | Laterality: Left

## 2019-11-24 MED ORDER — DEXAMETHASONE SODIUM PHOSPHATE 10 MG/ML IJ SOLN
INTRAMUSCULAR | Status: DC | PRN
  Administered 2019-11-24: 5 mg via INTRAVENOUS

## 2019-11-24 MED ORDER — METOCLOPRAMIDE HCL 5 MG/ML IJ SOLN
INTRAMUSCULAR | Status: DC | PRN
  Administered 2019-11-24: 10 mg via INTRAVENOUS

## 2019-11-24 MED ORDER — ONDANSETRON HCL 4 MG/2ML IJ SOLN
INTRAMUSCULAR | Status: DC | PRN
  Administered 2019-11-24: 4 mg via INTRAVENOUS

## 2019-11-24 MED ORDER — CEFAZOLIN SODIUM 1 G IJ SOLR
INTRAMUSCULAR | Status: AC
  Filled 2019-11-24: qty 20

## 2019-11-24 MED ORDER — KETOROLAC TROMETHAMINE 30 MG/ML IJ SOLN: 30.0000 mg | Freq: Once | INTRAMUSCULAR | Status: DC | PRN

## 2019-11-24 MED ORDER — HYDROMORPHONE HCL 1 MG/ML IJ SOLN: 0.2500 mg | INTRAMUSCULAR | Status: DC | PRN

## 2019-11-24 MED ORDER — METOCLOPRAMIDE HCL 5 MG/ML IJ SOLN
INTRAMUSCULAR | Status: AC
  Filled 2019-11-24: qty 2

## 2019-11-24 MED ORDER — CEFAZOLIN SODIUM-DEXTROSE 2-3 GM-%(50ML) IV SOLR
INTRAVENOUS | Status: DC | PRN
  Administered 2019-11-24: 2 g via INTRAVENOUS

## 2019-11-24 MED ORDER — ONDANSETRON HCL 4 MG/2ML IJ SOLN
INTRAMUSCULAR | Status: AC
  Filled 2019-11-24: qty 2

## 2019-11-24 MED ORDER — SCOPOLAMINE 1 MG/3DAYS TD PT72
1.0000 | MEDICATED_PATCH | TRANSDERMAL | Status: DC
  Administered 2019-11-24: 09:00:00 1.5 mg via TRANSDERMAL
  Filled 2019-11-24: qty 1

## 2019-11-24 MED ORDER — EPHEDRINE 5 MG/ML INJ
INTRAVENOUS | Status: AC
  Filled 2019-11-24: qty 10

## 2019-11-24 MED ORDER — MIDAZOLAM HCL 2 MG/2ML IJ SOLN
INTRAMUSCULAR | Status: AC
  Filled 2019-11-24: qty 2

## 2019-11-24 MED ORDER — FENTANYL CITRATE (PF) 250 MCG/5ML IJ SOLN
INTRAMUSCULAR | Status: DC | PRN
  Administered 2019-11-24: 50 ug via INTRAVENOUS

## 2019-11-24 MED ORDER — 0.9 % SODIUM CHLORIDE (POUR BTL) OPTIME
TOPICAL | Status: DC | PRN
  Administered 2019-11-24: 1000 mL

## 2019-11-24 MED ORDER — OXYCODONE HCL 5 MG/5ML PO SOLN: 5.0000 mg | Freq: Once | ORAL | Status: DC | PRN

## 2019-11-24 MED ORDER — MIDAZOLAM HCL 2 MG/2ML IJ SOLN
INTRAMUSCULAR | Status: DC | PRN
  Administered 2019-11-24: 2 mg via INTRAVENOUS

## 2019-11-24 MED ORDER — PROMETHAZINE HCL 25 MG/ML IJ SOLN: 6.2500 mg | INTRAMUSCULAR | Status: DC | PRN

## 2019-11-24 MED ORDER — DEXAMETHASONE SODIUM PHOSPHATE 10 MG/ML IJ SOLN
INTRAMUSCULAR | Status: AC
  Filled 2019-11-24: qty 1

## 2019-11-24 MED ORDER — PHENYLEPHRINE HCL (PRESSORS) 10 MG/ML IV SOLN
INTRAVENOUS | Status: DC | PRN
  Administered 2019-11-24: 120 ug via INTRAVENOUS
  Administered 2019-11-24: 80 ug via INTRAVENOUS

## 2019-11-24 MED ORDER — BUPIVACAINE HCL (PF) 0.25 % IJ SOLN
INTRAMUSCULAR | Status: AC
  Filled 2019-11-24: qty 30

## 2019-11-24 MED ORDER — AMOXICILLIN-POT CLAVULANATE 875-125 MG PO TABS: 1.0000 | ORAL_TABLET | Freq: Two times a day (BID) | ORAL | 0 refills | Status: DC

## 2019-11-24 MED ORDER — DIPHENHYDRAMINE HCL 50 MG/ML IJ SOLN
INTRAMUSCULAR | Status: AC
  Filled 2019-11-24: qty 1

## 2019-11-24 MED ORDER — LIDOCAINE 2% (20 MG/ML) 5 ML SYRINGE
INTRAMUSCULAR | Status: AC
  Filled 2019-11-24: qty 5

## 2019-11-24 MED ORDER — BUPIVACAINE HCL 0.25 % IJ SOLN
INTRAMUSCULAR | Status: DC | PRN
  Administered 2019-11-24: 10 mL

## 2019-11-24 MED ORDER — FENTANYL CITRATE (PF) 250 MCG/5ML IJ SOLN
INTRAMUSCULAR | Status: AC
  Filled 2019-11-24: qty 5

## 2019-11-24 MED ORDER — PROPOFOL 10 MG/ML IV BOLUS
INTRAVENOUS | Status: AC
  Filled 2019-11-24: qty 20

## 2019-11-24 MED ORDER — LIDOCAINE HCL (CARDIAC) PF 100 MG/5ML IV SOSY
PREFILLED_SYRINGE | INTRAVENOUS | Status: DC | PRN
  Administered 2019-11-24: 60 mg via INTRATRACHEAL

## 2019-11-24 MED ORDER — HYDROMORPHONE HCL 1 MG/ML IJ SOLN
INTRAMUSCULAR | Status: AC
  Administered 2019-11-24: 11:00:00 0.5 mg via INTRAVENOUS
  Filled 2019-11-24: qty 1

## 2019-11-24 MED ORDER — PROPOFOL 10 MG/ML IV BOLUS
INTRAVENOUS | Status: DC | PRN
  Administered 2019-11-24: 200 mg via INTRAVENOUS

## 2019-11-24 MED ORDER — MEPERIDINE HCL 25 MG/ML IJ SOLN: 6.2500 mg | INTRAMUSCULAR | Status: DC | PRN

## 2019-11-24 MED ORDER — ACETAMINOPHEN 500 MG PO TABS
1000.0000 mg | ORAL_TABLET | Freq: Once | ORAL | Status: AC
  Administered 2019-11-24: 09:00:00 1000 mg via ORAL
  Filled 2019-11-24: qty 2

## 2019-11-24 MED ORDER — PHENYLEPHRINE 40 MCG/ML (10ML) SYRINGE FOR IV PUSH (FOR BLOOD PRESSURE SUPPORT)
PREFILLED_SYRINGE | INTRAVENOUS | Status: AC
  Filled 2019-11-24: qty 10

## 2019-11-24 MED ORDER — OXYCODONE HCL 5 MG PO TABS: 5.0000 mg | ORAL_TABLET | Freq: Four times a day (QID) | ORAL | 0 refills | Status: DC | PRN

## 2019-11-24 MED ORDER — DIPHENHYDRAMINE HCL 50 MG/ML IJ SOLN
INTRAMUSCULAR | Status: DC | PRN
  Administered 2019-11-24: 12.5 mg via INTRAVENOUS

## 2019-11-24 MED ORDER — STERILE WATER FOR IRRIGATION IR SOLN
Status: DC | PRN
  Administered 2019-11-24: 1000 mL

## 2019-11-24 MED ORDER — LACTATED RINGERS IV SOLN: INTRAVENOUS | Status: DC

## 2019-11-24 MED ORDER — EPHEDRINE SULFATE 50 MG/ML IJ SOLN
INTRAMUSCULAR | Status: DC | PRN
  Administered 2019-11-24: 10 mg via INTRAVENOUS

## 2019-11-24 MED ORDER — OXYCODONE HCL 5 MG PO TABS: 5.0000 mg | ORAL_TABLET | Freq: Once | ORAL | Status: DC | PRN

## 2019-11-24 SURGICAL SUPPLY — 46 items
ADH SKN CLS APL DERMABOND .7 (GAUZE/BANDAGES/DRESSINGS) ×1
APL PRP STRL LF DISP 70% ISPRP (MISCELLANEOUS)
BINDER BREAST XLRG (GAUZE/BANDAGES/DRESSINGS) ×3 IMPLANT
BNDG GAUZE ELAST 4 BULKY (GAUZE/BANDAGES/DRESSINGS) IMPLANT
CANISTER SUCT 3000ML PPV (MISCELLANEOUS) IMPLANT
CHLORAPREP W/TINT 26 (MISCELLANEOUS) IMPLANT
CLEANER TIP ELECTROSURG 2X2 (MISCELLANEOUS) ×3 IMPLANT
CLOSURE STERI-STRIP 1/4X4 (GAUZE/BANDAGES/DRESSINGS) ×2 IMPLANT
COVER SURGICAL LIGHT HANDLE (MISCELLANEOUS) ×3 IMPLANT
COVER WAND RF STERILE (DRAPES) ×3 IMPLANT
DECANTER SPIKE VIAL GLASS SM (MISCELLANEOUS) IMPLANT
DERMABOND ADVANCED (GAUZE/BANDAGES/DRESSINGS) ×2
DERMABOND ADVANCED .7 DNX12 (GAUZE/BANDAGES/DRESSINGS) IMPLANT
DRAIN PENROSE 1/2X12 LTX STRL (WOUND CARE) ×2 IMPLANT
DRAPE HALF SHEET 40X57 (DRAPES) IMPLANT
DRAPE LAPAROSCOPIC ABDOMINAL (DRAPES) ×3 IMPLANT
DRSG PAD ABDOMINAL 8X10 ST (GAUZE/BANDAGES/DRESSINGS) IMPLANT
ELECT REM PT RETURN 9FT ADLT (ELECTROSURGICAL) ×3
ELECTRODE REM PT RTRN 9FT ADLT (ELECTROSURGICAL) ×1 IMPLANT
GAUZE 4X4 16PLY RFD (DISPOSABLE) IMPLANT
GAUZE SPONGE 4X4 12PLY STRL (GAUZE/BANDAGES/DRESSINGS) ×3 IMPLANT
GAUZE SPONGE 4X4 12PLY STRL LF (GAUZE/BANDAGES/DRESSINGS) ×2 IMPLANT
GLOVE BIO SURGEON STRL SZ7 (GLOVE) ×3 IMPLANT
GLOVE BIOGEL PI IND STRL 7.5 (GLOVE) ×1 IMPLANT
GLOVE BIOGEL PI INDICATOR 7.5 (GLOVE) ×2
GOWN STRL REUS W/ TWL LRG LVL3 (GOWN DISPOSABLE) ×2 IMPLANT
GOWN STRL REUS W/TWL LRG LVL3 (GOWN DISPOSABLE) ×6
KIT BASIN OR (CUSTOM PROCEDURE TRAY) ×3 IMPLANT
KIT TURNOVER KIT B (KITS) ×3 IMPLANT
NDL HYPO 25GX1X1/2 BEV (NEEDLE) IMPLANT
NEEDLE HYPO 25GX1X1/2 BEV (NEEDLE) IMPLANT
NS IRRIG 1000ML POUR BTL (IV SOLUTION) ×3 IMPLANT
PACK GENERAL/GYN (CUSTOM PROCEDURE TRAY) ×3 IMPLANT
PAD ABD 8X10 STRL (GAUZE/BANDAGES/DRESSINGS) ×2 IMPLANT
PAD ARMBOARD 7.5X6 YLW CONV (MISCELLANEOUS) ×6 IMPLANT
PENCIL SMOKE EVACUATOR (MISCELLANEOUS) ×3 IMPLANT
SPECIMEN JAR SMALL (MISCELLANEOUS) ×3 IMPLANT
SUT ETHILON 2 0 FS 18 (SUTURE) ×2 IMPLANT
SUT MNCRL AB 4-0 PS2 18 (SUTURE) IMPLANT
SUT VIC AB 3-0 SH 27 (SUTURE)
SUT VIC AB 3-0 SH 27XBRD (SUTURE) IMPLANT
SWAB COLLECTION DEVICE MRSA (MISCELLANEOUS) IMPLANT
SWAB CULTURE ESWAB REG 1ML (MISCELLANEOUS) IMPLANT
SYR CONTROL 10ML LL (SYRINGE) IMPLANT
TOWEL GREEN STERILE (TOWEL DISPOSABLE) ×3 IMPLANT
TOWEL GREEN STERILE FF (TOWEL DISPOSABLE) ×3 IMPLANT

## 2019-11-24 NOTE — Discharge Summary (Signed)
    Patient ID: Hawi Butler [id number] 1980-11-02 39 y.o.  Admit date: 11/23/2019 Discharge date: 11/24/2019  Admitting Diagnosis: BREAST ABSCESS OF FEMALE   Discharge Diagnosis Patient Active Problem List   Diagnosis Date Noted  . Screening breast examination 11/23/2019  . Left breast lump 11/23/2019  . Breast pain, left 11/23/2019  . Breast abscess 11/23/2019  . Normal labor and delivery 06/28/2014  . Breast abscess during pregnancy, antepartum 05/31/2014  . Abdominal pregnancy with intrauterine pregnancy 05/12/2014  . Left breast abscess 04/28/2014    Consultants None  H&P: The patient is a 39 year old female who presents with a breast abscess. She is presenting to the office per request of the [facility]. In 2015, Dr. Zella Richer incised and drained chronic left breast abscesses in the OR. 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 In 2019, she underwent I&D of lower left breast abscess in the OR when she was living in [place].   Since then, she has not had any issues in her left breast until about 2 weeks ago. She completed a ten-day course of clindamycin with no improvement, so she underwent mammogram and ultrasound this morning. I spoke to Dr. Enriqueta Shutter on the phone who describes seeing a 10 cm x 10 cm multiloculated lower left breast abscess. He attempted aspiration with a 12-gauge needle but was only able to aspirate 5 cc of purulent drainage from the largest component of the loculated area. She states the area is very tender. She denies any drainage. Denies fevers.  Procedures Dr. Donne Hazel - Incision and drainage of left breast abscess, left breast incisional biopsy, placement of penrose drain - 11/24/2019  Hospital Course:  Patient admitted for left breast abscess and started on abx. She underwent above procedure and tolerated this well. On POD 0, the patient was  voiding well, tolerating diet, ambulating well, pain well controlled, vital signs stable, incisions c/d/i with penrose drain in place and felt stable for discharge home.  Physical Exam: Gen: Awake and alert, NAD Breast Exam: Left breast incision clean and dry with penrose drain intact. No significant drainage. Mild bloody drainage on gauze.  Lungs: Normal rate and effort   Allergies as of 11/24/2019   No Known Allergies     Medication List    TAKE these medications   amoxicillin-clavulanate 875-125 MG tablet Commonly known as: AUGMENTIN Take 1 tablet by mouth every 12 (twelve) hours.   oxyCODONE 5 MG immediate release tablet Commonly known as: Oxy IR/ROXICODONE Take 1 tablet (5 mg total) by mouth every 6 (six) hours as needed for breakthrough pain.        Follow-up Information    Rolm Bookbinder, MD. Go on 12/02/2019.   Specialty: General Surgery Why: at 1015am for follow up. Please arrive 15 minutes early. Please bring a copy of your photo ID and insurance card.  Contact information: [address] [place] [address] [phone]           Signed: Alferd Apa, [facility] 11/24/2019, 4:12 PM Please see Amion for pager number during day hours 7:00am-4:30pm

## 2019-11-24 NOTE — Anesthesia Preprocedure Evaluation (Addendum)
Anesthesia Evaluation  Patient identified by MRN, date of birth, ID band Patient awake    Reviewed: Allergy & Precautions, H&P , NPO status , Patient's Chart, lab work & pertinent test results, reviewed documented beta blocker date and time   Airway Mallampati: II  TM Distance: >3 FB Neck ROM: full    Dental no notable dental hx.    Pulmonary neg pulmonary ROS,    Pulmonary exam normal breath sounds clear to auscultation       Cardiovascular negative cardio ROS Normal cardiovascular exam Rhythm:regular Rate:Normal     Neuro/Psych negative neurological ROS  negative psych ROS   GI/Hepatic negative GI ROS, Neg liver ROS,   Endo/Other  negative endocrine ROS  Renal/GU negative Renal ROS  negative genitourinary   Musculoskeletal negative musculoskeletal ROS (+)   Abdominal   Peds negative pediatric ROS (+)  Hematology negative hematology ROS (+)   Anesthesia Other Findings Breast abscess left breast   Reproductive/Obstetrics negative OB ROS                             Anesthesia Physical  Anesthesia Plan  ASA: II  Anesthesia Plan: General   Post-op Pain Management:    Induction: Intravenous  PONV Risk Score and Plan: 4 or greater and Dexamethasone, Midazolam, Scopolamine patch - Pre-op, Ondansetron, Diphenhydramine and Metaclopromide  Airway Management Planned: LMA  Additional Equipment: None  Intra-op Plan:   Post-operative Plan: Extubation in OR  Informed Consent: I have reviewed the patients History and Physical, chart, labs and discussed the procedure including the risks, benefits and alternatives for the proposed anesthesia with the patient or authorized representative who has indicated his/her understanding and acceptance.     Dental Advisory Given and Dental advisory given  Plan Discussed with: CRNA  Anesthesia Plan Comments:        Anesthesia Quick  Evaluation

## 2019-11-24 NOTE — Anesthesia Postprocedure Evaluation (Signed)
Anesthesia Post Note  Patient: Control and instrumentation engineer  Procedure(s) Performed: IRRIGATION AND DEBRIDEMENT BREAST ABSCESS (Left Breast)     Patient location during evaluation: PACU Anesthesia Type: General Level of consciousness: awake and alert, oriented and patient cooperative Pain management: pain level controlled Vital Signs Assessment: post-procedure vital signs reviewed and stable Respiratory status: spontaneous breathing, nonlabored ventilation and respiratory function stable Cardiovascular status: blood pressure returned to baseline and stable Postop Assessment: no apparent nausea or vomiting Anesthetic complications: no    Last Vitals:  Vitals:   11/24/19 1100 11/24/19 1110  BP:  115/66  Pulse: 76 80  Resp: (!) 24 20  Temp:    SpO2: 100% 100%    Last Pain:  Vitals:   11/24/19 1110  TempSrc:   PainSc: [place]

## 2019-11-24 NOTE — Transfer of Care (Signed)
Immediate Anesthesia Transfer of Care Note  Patient: Samantha Sweeney  Procedure(s) Performed: IRRIGATION AND DEBRIDEMENT BREAST ABSCESS (Left Breast)  Patient Location: PACU  Anesthesia Type:General  Level of Consciousness: drowsy and patient cooperative  Airway & Oxygen Therapy: Patient Spontanous Breathing  Post-op Assessment: Report given to RN and Post -op Vital signs reviewed and stable  Post vital signs: Reviewed and stable  Last Vitals:  Vitals Value Taken Time  BP    Temp    Pulse 90 11/24/19 1052  Resp 16 11/24/19 1052  SpO2 100 % 11/24/19 1052  Vitals shown include unvalidated device data.  Last Pain:  Vitals:   11/24/19 0748  TempSrc:   PainSc: 0-No pain         Complications: No apparent anesthesia complications

## 2019-11-24 NOTE — Anesthesia Procedure Notes (Signed)
Procedure Name: LMA Insertion Date/Time: 11/24/2019 10:53 AM Performed by: Kathryne Hitch, CRNA Pre-anesthesia Checklist: Patient identified, Emergency Drugs available, Suction available and Patient being monitored Patient Re-evaluated:Patient Re-evaluated prior to induction Oxygen Delivery Method: Circle system utilized Preoxygenation: Pre-oxygenation with 100% oxygen Induction Type: IV induction Ventilation: Mask ventilation without difficulty LMA: LMA inserted LMA Size: 4.0 Number of attempts: 1 Tube secured with: Tape Dental Injury: Teeth and Oropharynx as per pre-operative assessment

## 2019-11-24 NOTE — Interval H&P Note (Signed)
History and Physical Interval Note:  11/24/2019 9:51 AM I have seen and evaluated patient, reviewed imaging, will proceed with drainage today, this is her thrid left breast abscess, will also discuss bca risk on return to office Audelia Hives  has presented today for surgery, with the diagnosis of abscess.  The various methods of treatment have been discussed with the patient and family. After consideration of risks, benefits and other options for treatment, the patient has consented to  Procedure(s): IRRIGATION AND DEBRIDEMENT BREAST ABSCESS (Left) as a surgical intervention.  The patient's history has been reviewed, patient examined, no change in status, stable for surgery.  I have reviewed the patient's chart and labs.  Questions were answered to the patient's satisfaction.     Rolm Bookbinder

## 2019-11-24 NOTE — Discharge Instructions (Signed)
Wound Care, Adult Taking care of your wound properly can help to prevent pain, infection, and scarring. It can also help your wound to heal more quickly. How to care for your wound Wound care Please leave your penrose drain in place You can shower when you get home. Let soapy water run over your wound. Pat dry. Cover with dry dressing.       Follow instructions from your health care provider about how to take care of your wound. Make sure you: ? Wash your hands with soap and water before you change the bandage (dressing). If soap and water are not available, use hand sanitizer. ? Change your dressing as told by your health care provider. ? Leave stitches (sutures), skin glue, or adhesive strips in place. These skin closures may need to stay in place for 2 weeks or longer. If adhesive strip edges start to loosen and curl up, you may trim the loose edges. Do not remove adhesive strips completely unless your health care provider tells you to do that.  Check your wound area every day for signs of infection. Check for: ? Redness, swelling, or pain. ? Fluid or blood. ? Warmth. ? Pus or a bad smell.  Ask your health care provider if you should clean the wound with mild soap and water. Doing this may include: ? Using a clean towel to pat the wound dry after cleaning it. Do not rub or scrub the wound. ? Applying a cream or ointment. Do this only as told by your health care provider. ? Covering the incision with a clean dressing.  Ask your health care provider when you can leave the wound uncovered.  Keep the dressing dry until your health care provider says it can be removed. Do not take baths, swim, use a hot tub, or do anything that would put the wound underwater until your health care provider approves. Ask your health care provider if you can take showers. You may only be allowed to take sponge baths. Medicines   If you were prescribed an antibiotic medicine, cream, or ointment, take or  use the antibiotic as told by your health care provider. Do not stop taking or using the antibiotic even if your condition improves.  Take over-the-counter and prescription medicines only as told by your health care provider. If you were prescribed pain medicine, take it 30 or more minutes before you do any wound care or as told by your health care provider. General instructions  Return to your normal activities as told by your health care provider. Ask your health care provider what activities are safe.  Do not scratch or pick at the wound.  Do not use any products that contain nicotine or tobacco, such as cigarettes and e-cigarettes. These may delay wound healing. If you need help quitting, ask your health care provider.  Keep all follow-up visits as told by your health care provider. This is important.  Eat a diet that includes protein, vitamin A, vitamin C, and other nutrient-rich foods to help the wound heal. ? Foods rich in protein include meat, dairy, beans, nuts, and other sources. ? Foods rich in vitamin A include carrots and dark green, leafy vegetables. ? Foods rich in vitamin C include citrus, tomatoes, and other fruits and vegetables. ? Nutrient-rich foods have protein, carbohydrates, fat, vitamins, or minerals. Eat a variety of healthy foods including vegetables, fruits, and whole grains. Contact a health care provider if:  You received a tetanus shot and you have swelling,  severe pain, redness, or bleeding at the injection site.  Your pain is not controlled with medicine.  You have redness, swelling, or pain around the wound.  You have fluid or blood coming from the wound.  Your wound feels warm to the touch.  You have pus or a bad smell coming from the wound.  You have a fever or chills.  You are nauseous or you vomit.  You are dizzy. Get help right away if:  You have a red streak going away from your wound.  The edges of the wound open up and  separate.  Your wound is bleeding, and the bleeding does not stop with gentle pressure.  You have a rash.  You faint.  You have trouble breathing. Summary  Always wash your hands with soap and water before changing your bandage (dressing).  To help with healing, eat foods that are rich in protein, vitamin A, vitamin C, and other nutrients.  Check your wound every day for signs of infection. Contact your health care provider if you suspect that your wound is infected. This information is not intended to replace advice given to you by your health care provider. Make sure you discuss any questions you have with your health care provider. Document Revised: 01/18/2019 Document Reviewed: 04/16/2016 Elsevier Patient Education  [place].  ........Marland Kitchen   Managing Your Pain After Surgery Without Opioids    Thank you for participating in our program to help patients manage their pain after surgery without opioids. This is part of our effort to provide you with the best care possible, without exposing you or your family to the risk that opioids pose.  What pain can I expect after surgery? You can expect to have some pain after surgery. This is normal. The pain is typically worse the day after surgery, and quickly begins to get better. Many studies have found that many patients are able to manage their pain after surgery with Over-the-Counter (OTC) medications such as Tylenol and Motrin. If you have a condition that does not allow you to take Tylenol or Motrin, notify your surgical team.  How will I manage my pain? The best strategy for controlling your pain after surgery is around the clock pain control with Tylenol (acetaminophen) and Motrin (ibuprofen or Advil). Alternating these medications with each other allows you to maximize your pain control. In addition to Tylenol and Motrin, you can use heating pads or ice packs on your incisions to help reduce your pain.  How will I  alternate your regular strength over-the-counter pain medication? You will take a dose of pain medication every three hours. ; Start by taking 650 mg of Tylenol (2 pills of 325 mg) ; 3 hours later take 600 mg of Motrin (3 pills of 200 mg) ; 3 hours after taking the Motrin take 650 mg of Tylenol ; 3 hours after that take 600 mg of Motrin.   - 1 -  See example - if your first dose of Tylenol is at 12:00 PM   12:00 PM Tylenol 650 mg (2 pills of 325 mg)  3:00 PM Motrin 600 mg (3 pills of 200 mg)  6:00 PM Tylenol 650 mg (2 pills of 325 mg)  9:00 PM Motrin 600 mg (3 pills of 200 mg)  Continue alternating every 3 hours   We recommend that you follow this schedule around-the-clock for at least 3 days after surgery, or until you feel that it is no longer needed. Use the table  on the last page of this handout to keep track of the medications you are taking. Important: Do not take more than 3000mg  of Tylenol or 3200mg  of Motrin in a 24-hour period. Do not take ibuprofen/Motrin if you have a history of bleeding stomach ulcers, severe kidney disease, &/or actively taking a blood thinner  What if I still have pain? If you have pain that is not controlled with the over-the-counter pain medications (Tylenol and Motrin or Advil) you might have what we call "breakthrough" pain. You will receive a prescription for a small amount of an opioid pain medication such as Oxycodone, Tramadol, or Tylenol with Codeine. Use these opioid pills in the first 24 hours after surgery if you have breakthrough pain. Do not take more than 1 pill every 4-6 hours.  If you still have uncontrolled pain after using all opioid pills, don't hesitate to call our staff using the number provided. We will help make sure you are managing your pain in the best way possible, and if necessary, we can provide a prescription for additional pain medication.   Day 1    Time  Name of Medication Number of pills taken  Amount of  Acetaminophen  Pain Level   Comments  AM PM       AM PM       AM PM       AM PM       AM PM       AM PM       AM PM       AM PM       Total Daily amount of Acetaminophen Do not take more than  3,000 mg per day      Day 2    Time  Name of Medication Number of pills taken  Amount of Acetaminophen  Pain Level   Comments  AM PM       AM PM       AM PM       AM PM       AM PM       AM PM       AM PM       AM PM       Total Daily amount of Acetaminophen Do not take more than  3,000 mg per day      Day 3    Time  Name of Medication Number of pills taken  Amount of Acetaminophen  Pain Level   Comments  AM PM       AM PM       AM PM       AM PM          AM PM       AM PM       AM PM       AM PM       Total Daily amount of Acetaminophen Do not take more than  3,000 mg per day      Day 4    Time  Name of Medication Number of pills taken  Amount of Acetaminophen  Pain Level   Comments  AM PM       AM PM       AM PM       AM PM       AM PM       AM PM       AM PM       AM  PM       Total Daily amount of Acetaminophen Do not take more than  3,000 mg per day      Day 5    Time  Name of Medication Number of pills taken  Amount of Acetaminophen  Pain Level   Comments  AM PM       AM PM       AM PM       AM PM       AM PM       AM PM       AM PM       AM PM       Total Daily amount of Acetaminophen Do not take more than  3,000 mg per day       Day 6    Time  Name of Medication Number of pills taken  Amount of Acetaminophen  Pain Level  Comments  AM PM       AM PM       AM PM       AM PM       AM PM       AM PM       AM PM       AM PM       Total Daily amount of Acetaminophen Do not take more than  3,000 mg per day      Day 7    Time  Name of Medication Number of pills taken  Amount of Acetaminophen  Pain Level   Comments  AM PM       AM PM       AM PM       AM PM       AM PM       AM PM       AM PM        AM PM       Total Daily amount of Acetaminophen Do not take more than  3,000 mg per day        For additional information about how and where to safely dispose of unused opioid medications - [employer]  Disclaimer: This document contains information and/or instructional materials adapted from [place] for the typical patient with your condition. It does not replace medical advice from your health care provider because your experience may differ from that of the typical patient. Talk to your health care provider if you have any questions about this document, your condition or your treatment plan. Adapted from [employer]

## 2019-11-24 NOTE — Op Note (Signed)
Preoperative diagnosis:.  Chronic left breast abscess Postoperative diagnosis: Same as above Procedure: Incision and drainage of left breast abscess, left breast incisional biopsy Surgeon: Dr. Serita Grammes Anesthesia: General Estimated blood loss: Minimal Drains: Penrose drain to cavity Specimens: 1.  Cultures to microbiology 2.  Left breast tissue to pathology Complications: None Sponge needle count was correct at completion Disposition to recovery in stable condition  Indications: This is a 39 year old female with a family history of breast cancer in her mom at a young age.  She has had multiple left breast abscesses.  [place]  Presented with a couple week history of a left breast abscess that is unable to be treated radiologically.  She was admitted from the office by one of my partners to undergo drainage.  On evaluation she clearly does has a chronic abscess without any real active evidence of infection.  Procedure: After informed consent was obtained the patient was taken to the operating room.  She was given antibiotics.  SCDs were in place.  She was placed under general anesthesia without complication.  Her left breast was prepped and draped in the standard sterile surgical fashion.  Surgical timeout was then performed.  I infiltrated Marcaine and made a periareolar incision.  I then tunneled to the cavity which was sizable.  I entered into the cavity.  There was a small amount of purulence present.  I then cultured this.  I then opened up the entire cavity which was really just induration and old scar tissue.  I irrigated this copiously.  I then placed 1/2 inch Penrose drain in the cavity and sutured this to the skin with a 2-0 nylon suture.  I then closed the incision with 3-0 Vicryl and 4-0 Monocryl.  Dressings were placed a binder was placed.  I will send her home later today on antibiotics and the induration should hopefully resolve over some  time

## 2019-11-25 ENCOUNTER — Other Ambulatory Visit: Payer: Private Health Insurance - Indemnity

## 2019-11-26 ENCOUNTER — Other Ambulatory Visit: Payer: Self-pay | Admitting: Physician Assistant

## 2019-11-28 LAB — AEROBIC/ANAEROBIC CULTURE W GRAM STAIN (SURGICAL/DEEP WOUND)

## 2019-11-28 LAB — SURGICAL PATHOLOGY

## 2019-11-28 LAB — AEROBIC/ANAEROBIC CULTURE (SURGICAL/DEEP WOUND)

## 2019-11-29 LAB — AEROBIC/ANAEROBIC CULTURE W GRAM STAIN (SURGICAL/DEEP WOUND)
Culture: NO GROWTH
Gram Stain: NONE SEEN

## 2020-04-26 ENCOUNTER — Other Ambulatory Visit: Payer: Self-pay | Admitting: Obstetrics and Gynecology

## 2020-04-26 ENCOUNTER — Other Ambulatory Visit: Payer: Self-pay | Admitting: General Surgery

## 2020-04-26 DIAGNOSIS — N611 Abscess of the breast and nipple: Secondary | ICD-10-CM

## 2020-04-26 DIAGNOSIS — N6320 Unspecified lump in the left breast, unspecified quadrant: Secondary | ICD-10-CM

## 2020-05-23 ENCOUNTER — Other Ambulatory Visit: Payer: Self-pay

## 2020-05-23 ENCOUNTER — Ambulatory Visit
Admission: RE | Admit: 2020-05-23 | Discharge: 2020-05-23 | Disposition: A | Discharge status: Home / Self Care | Payer: No Typology Code available for payment source | Source: Ambulatory Visit | Attending: General Surgery | Admitting: General Surgery

## 2020-05-23 ENCOUNTER — Ambulatory Visit
Admission: RE | Admit: 2020-05-23 | Discharge: 2020-05-23 | Disposition: A | Discharge status: Home / Self Care | Payer: No Typology Code available for payment source | Source: Ambulatory Visit | Attending: Obstetrics and Gynecology | Admitting: Obstetrics and Gynecology

## 2020-05-23 DIAGNOSIS — N611 Abscess of the breast and nipple: Secondary | ICD-10-CM

## 2020-10-14 HISTORY — PX: BREAST BIOPSY: SHX20

## 2020-11-13 ENCOUNTER — Other Ambulatory Visit: Payer: Self-pay | Admitting: Obstetrics and Gynecology

## 2020-11-13 DIAGNOSIS — N6320 Unspecified lump in the left breast, unspecified quadrant: Secondary | ICD-10-CM

## 2020-11-16 ENCOUNTER — Ambulatory Visit
Admission: RE | Admit: 2020-11-16 | Discharge: 2020-11-16 | Disposition: A | Discharge status: Home / Self Care | Payer: No Typology Code available for payment source | Source: Ambulatory Visit | Attending: Obstetrics and Gynecology | Admitting: Obstetrics and Gynecology

## 2020-11-16 ENCOUNTER — Ambulatory Visit: Payer: No Typology Code available for payment source

## 2020-11-16 ENCOUNTER — Other Ambulatory Visit: Payer: Self-pay

## 2020-11-16 DIAGNOSIS — N6320 Unspecified lump in the left breast, unspecified quadrant: Secondary | ICD-10-CM

## 2020-12-07 ENCOUNTER — Other Ambulatory Visit: Payer: Self-pay | Admitting: Obstetrics and Gynecology

## 2020-12-07 DIAGNOSIS — Z803 Family history of malignant neoplasm of breast: Secondary | ICD-10-CM

## 2020-12-20 ENCOUNTER — Other Ambulatory Visit: Payer: Self-pay

## 2020-12-20 ENCOUNTER — Ambulatory Visit
Admission: RE | Admit: 2020-12-20 | Discharge: 2020-12-20 | Disposition: A | Discharge status: Home / Self Care | Payer: Federal, State, Local not specified - PPO | Source: Ambulatory Visit | Attending: Obstetrics and Gynecology | Admitting: Obstetrics and Gynecology

## 2020-12-20 DIAGNOSIS — Z803 Family history of malignant neoplasm of breast: Secondary | ICD-10-CM

## 2020-12-20 MED ORDER — GADOBUTROL 1 MMOL/ML IV SOLN
10.0000 mL | Freq: Once | INTRAVENOUS | Status: AC | PRN
  Administered 2020-12-20: 10 mL via INTRAVENOUS

## 2020-12-26 ENCOUNTER — Other Ambulatory Visit: Payer: Self-pay | Admitting: Obstetrics and Gynecology

## 2020-12-27 ENCOUNTER — Other Ambulatory Visit: Payer: Self-pay | Admitting: Obstetrics and Gynecology

## 2020-12-27 DIAGNOSIS — R9389 Abnormal findings on diagnostic imaging of other specified body structures: Secondary | ICD-10-CM

## 2020-12-29 ENCOUNTER — Other Ambulatory Visit: Payer: Self-pay

## 2020-12-29 ENCOUNTER — Other Ambulatory Visit: Payer: Self-pay | Admitting: Diagnostic Radiology

## 2020-12-29 ENCOUNTER — Ambulatory Visit
Admission: RE | Admit: 2020-12-29 | Discharge: 2020-12-29 | Disposition: A | Discharge status: Home / Self Care | Payer: Federal, State, Local not specified - PPO | Source: Ambulatory Visit | Attending: Obstetrics and Gynecology | Admitting: Obstetrics and Gynecology

## 2020-12-29 DIAGNOSIS — R9389 Abnormal findings on diagnostic imaging of other specified body structures: Secondary | ICD-10-CM

## 2020-12-29 MED ORDER — GADOBUTROL 1 MMOL/ML IV SOLN
10.0000 mL | Freq: Once | INTRAVENOUS | Status: AC | PRN
  Administered 2020-12-29: 10 mL via INTRAVENOUS

## 2021-07-16 ENCOUNTER — Other Ambulatory Visit: Payer: Self-pay | Admitting: Obstetrics and Gynecology

## 2021-07-16 DIAGNOSIS — D241 Benign neoplasm of right breast: Secondary | ICD-10-CM

## 2021-07-29 ENCOUNTER — Ambulatory Visit
Admission: RE | Admit: 2021-07-29 | Discharge: 2021-07-29 | Disposition: A | Discharge status: Home / Self Care | Payer: Federal, State, Local not specified - PPO | Source: Ambulatory Visit | Attending: Obstetrics and Gynecology | Admitting: Obstetrics and Gynecology

## 2021-07-29 DIAGNOSIS — D241 Benign neoplasm of right breast: Secondary | ICD-10-CM

## 2021-07-29 MED ORDER — GADOBUTROL 1 MMOL/ML IV SOLN
9.0000 mL | Freq: Once | INTRAVENOUS | Status: AC | PRN
  Administered 2021-07-29: 9 mL via INTRAVENOUS

## 2022-06-18 IMAGING — MR MR BREAST BILAT WO/W CM
8 of 12 series · 32 of 48 positions shown · IV contrast (gadavist)
Comparison: Previous exam(s).

CLINICAL DATA: History of benign LEFT breast biopsy in 7122.
History of MR guided core biopsy of the RIGHT breast in Saturday December, 2020. Pathology showed concordant fibroadenoma with
pseudoangiomatous stromal hyperplasia and fibrocystic
changes/apocrine metaplasia. Follow-up breast MRI was recommended in
6 months. Patient's mother was diagnosed with breast cancer at age
37. History describes "Left breast tingles at times and has noticed
a discoloration over the last six months ".

LABS:  None obtained at the time of imaging.
EXAM:
BILATERAL BREAST MRI WITH AND WITHOUT CONTRAST
TECHNIQUE: Multiplanar, multisequence MR images of both breasts were obtained
prior to and following the intravenous administration of 9 ml of
Gadavist

[Series 2: t2_tirm_tra ipat (a-p) · axial · 3.0mm · 0.78mm/px · 1 of 60 slices shown]
[im 1/60]
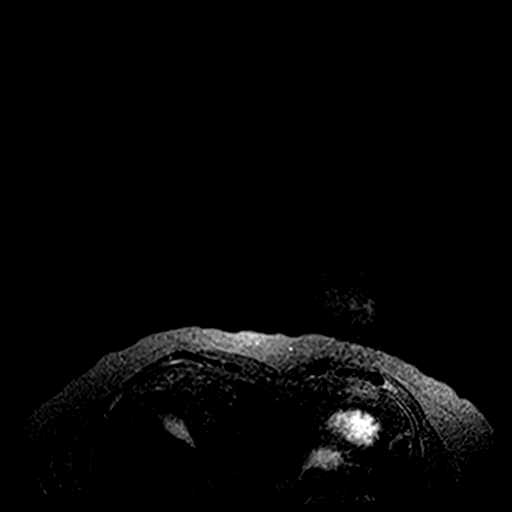

[Series 3: fl3d pre-cm no · axial · non-contrast · 1.2mm · 1.04mm/px · z∈[-64,+127]mm · 5 of 160 slices shown]
[im 1/160]
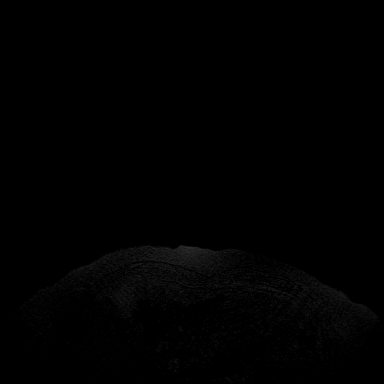
[im 40/160]
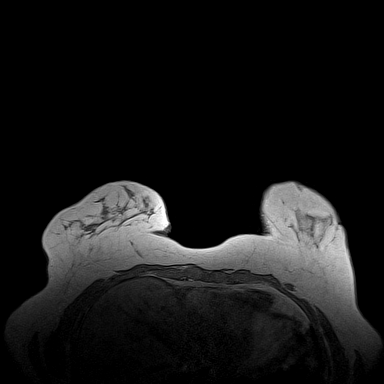
[im 80/160]
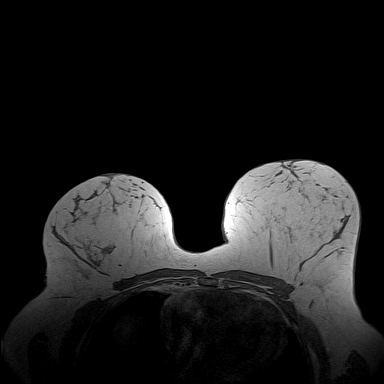
[im 120/160]
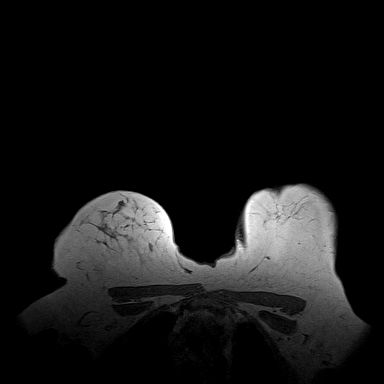
[im 160/160]
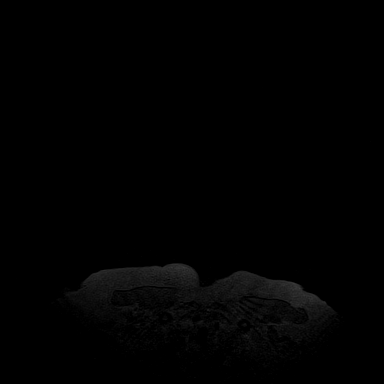

[Series 7: fl3d pre-cm · axial · non-contrast · 1.2mm · 1.04mm/px · z∈[-66,+124]mm · 5 of 160 slices shown]
[im 1/160]
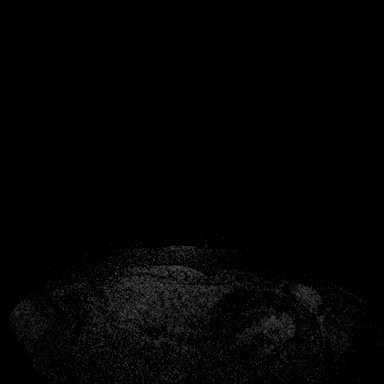
[im 40/160]
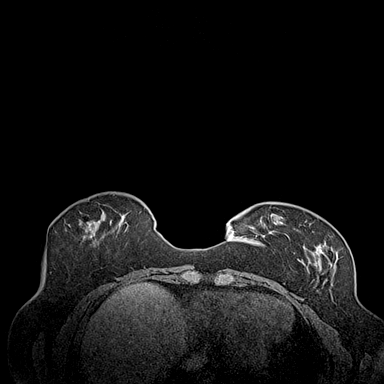
[im 80/160]
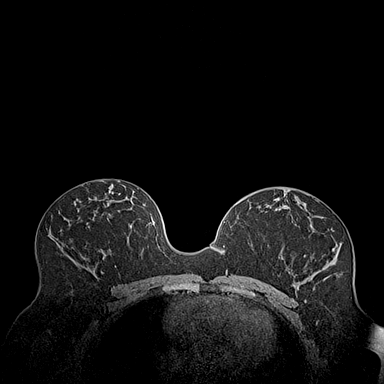
[im 120/160]
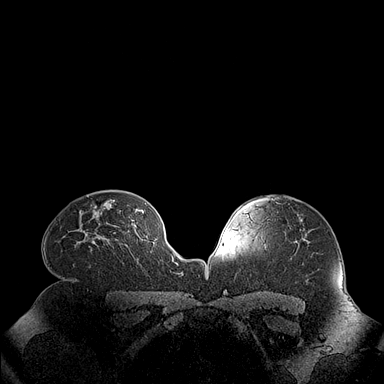
[im 160/160]
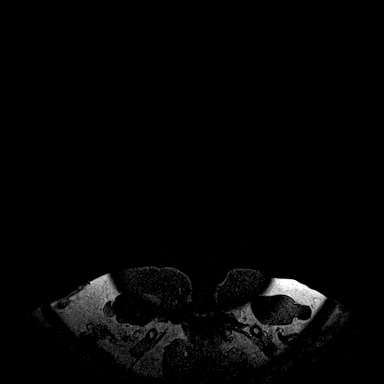

[Series 8: fl3d post-cm 20 · axial · 1.2mm · 1.04mm/px · z∈[-66,+124]mm · 5 of 160 slices shown (1 of 3)]
[im 1/160]
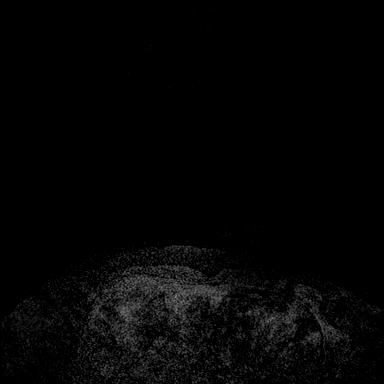
[im 40/160]
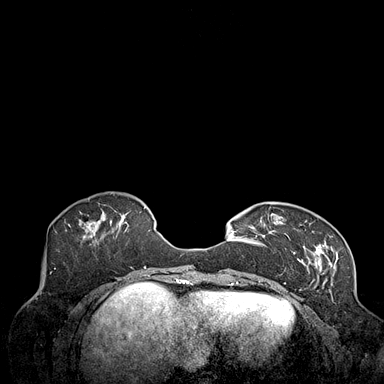
[im 80/160]
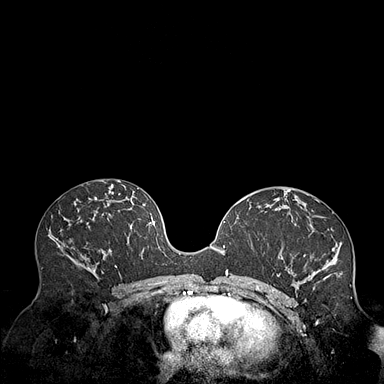
[im 120/160]
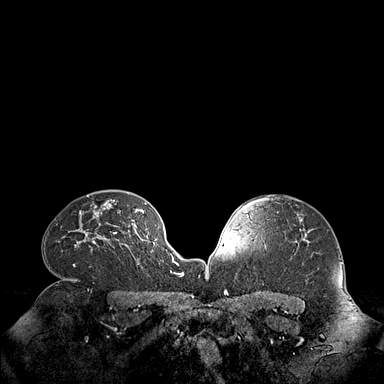
[im 160/160]
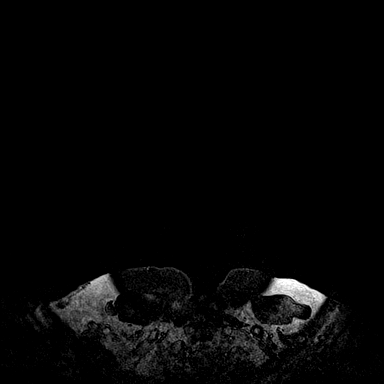

[Series 9: fl3d post-cm 20 · axial · 1.2mm · 1.04mm/px · z∈[-66,+124]mm · 5 of 160 slices shown (2 of 3)]
[im 1/160]
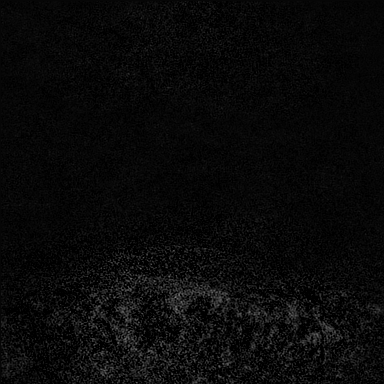
[im 40/160]
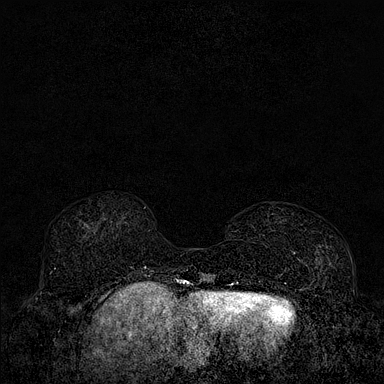
[im 80/160]
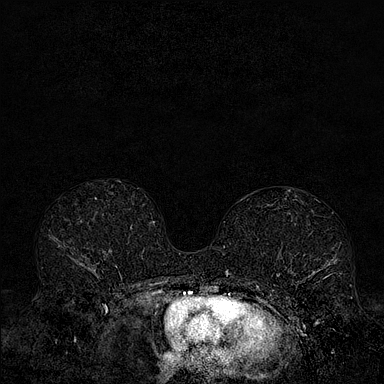
[im 120/160]
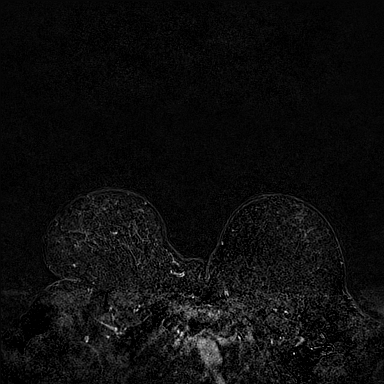
[im 160/160]
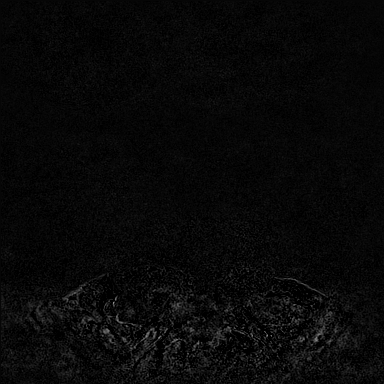

[Series 10: fl3d post-cm 20 · axial · 192.0mm · 1.04mm/px · 1 of 1 slices shown (3 of 3)]
[im 1/1]
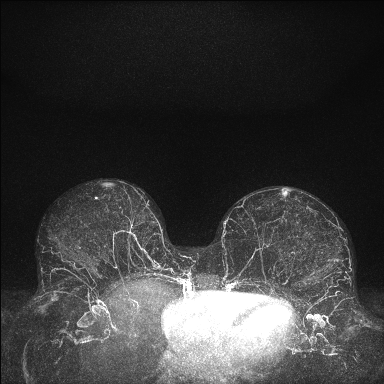

[Series 11: fl3d post-cm 3 · axial · 1.2mm · 1.04mm/px · z∈[-66,+124]mm · 6 of 160 slices shown (1 of 2)]
[im 1/160]
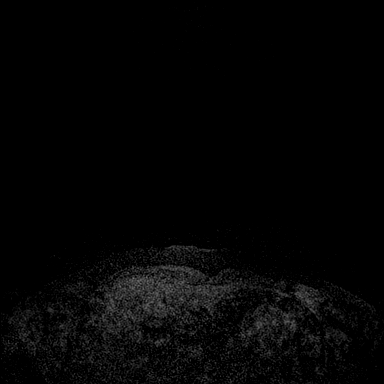
[im 32/160]
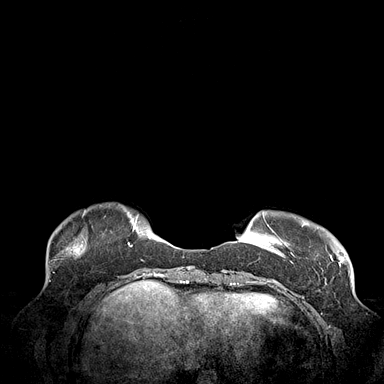
[im 64/160]
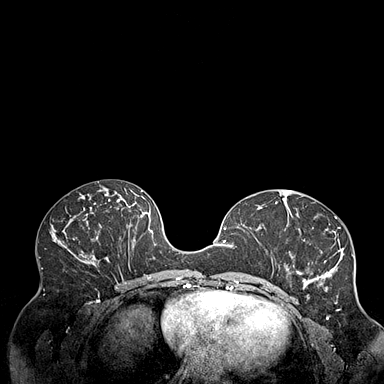
[im 96/160]
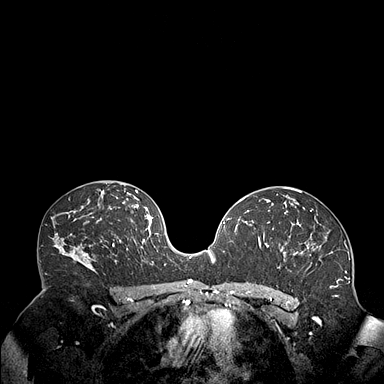
[im 128/160]
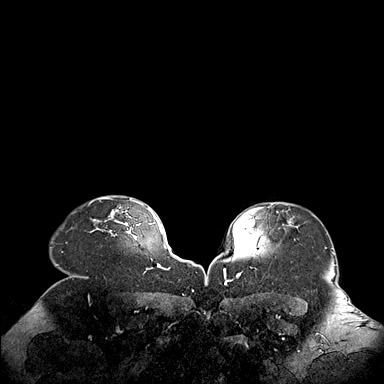
[im 160/160]
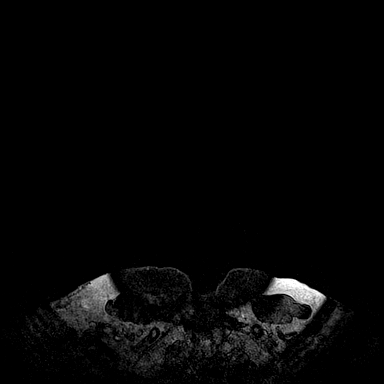

[Series 12: fl3d post-cm 3 · axial · 1.2mm · 1.04mm/px · z∈[-66,+48]mm · 4 of 160 slices shown (2 of 2)]
[im 1/160]
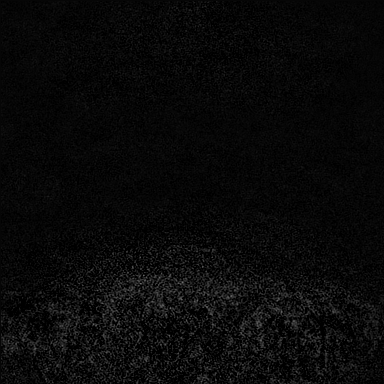
[im 32/160]
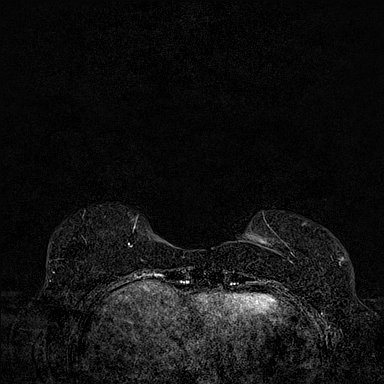
[im 64/160]
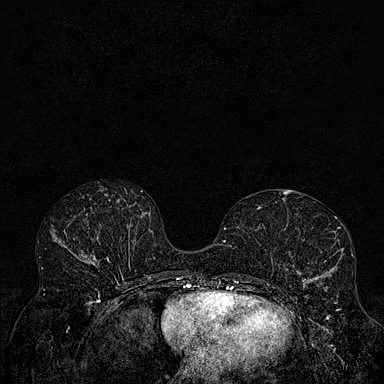
[im 96/160]
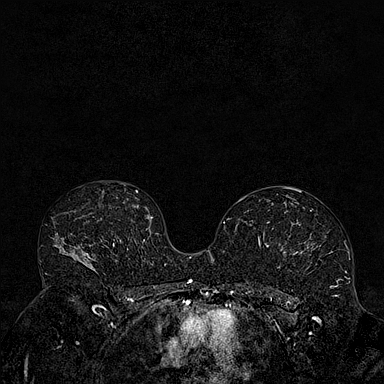

[32 of 48 positions shown; findings below may reference images not displayed]

Three-dimensional MR images were rendered by post-processing of the
original MR data on an independent workstation. The
three-dimensional MR images were interpreted, and findings are
reported in the following complete MRI report for this study. Three
dimensional images were evaluated at the independent interpreting
workstation using the DynaCAD thin client.
FINDINGS: Breast composition: c. Heterogeneous fibroglandular tissue.

Background parenchymal enhancement: Moderate.

Right breast: Tissue marker clip is identified in the LOWER central
RIGHT breast approximately 5 millimeters MEDIAL to a 5 millimeter
enhancing mass (image 79 of series 14 and 15). Findings are
consistent with previously biopsied fibroadenoma. No suspicious
enhancement identified in the RIGHT breast. Stable RIGHT nipple
retraction.

Left breast: No mass or abnormal enhancement. Stable LEFT nipple
retraction.

Lymph nodes: No abnormal appearing lymph nodes.

Ancillary findings:  None.
IMPRESSION: 1. No MRI evidence for malignancy.
2. Tissue marker clip adjacent to previously biopsied 5 millimeter
fibroadenoma in the anterior LOWER OUTER QUADRANT of the RIGHT
breast.

RECOMMENDATION:
Recommend bilateral screening mammogram in November 2021.

Recommend physical exam correlation with the history describing LEFT
breast discoloration.

Based on the recommendations of the [employer] Cancer Society, annual
screening MRI is suggested in addition to annual mammography if the
patient has an estimated lifetime risk of developing breast cancer
which is greater than 20%.

BI-RADS CATEGORY  2: Benign.

## 2023-07-15 DIAGNOSIS — Z419 Encounter for procedure for purposes other than remedying health state, unspecified: Secondary | ICD-10-CM | POA: Diagnosis not present

## 2023-08-15 ENCOUNTER — Encounter: Payer: Self-pay | Admitting: Ophthalmology

## 2023-08-15 ENCOUNTER — Ambulatory Visit (HOSPITAL_COMMUNITY): Payer: Federal, State, Local not specified - PPO | Admitting: Anesthesiology

## 2023-08-15 ENCOUNTER — Ambulatory Visit (HOSPITAL_COMMUNITY)
Admission: AD | Admit: 2023-08-15 | Discharge: 2023-08-15 | Disposition: A | Discharge status: Home / Self Care | Payer: Federal, State, Local not specified - PPO | Source: Ambulatory Visit | Attending: Ophthalmology | Admitting: Ophthalmology

## 2023-08-15 ENCOUNTER — Encounter (HOSPITAL_COMMUNITY)
Admission: AD | Disposition: A | Discharge status: Home / Self Care | Payer: Self-pay | Source: Ambulatory Visit | Attending: Ophthalmology

## 2023-08-15 ENCOUNTER — Other Ambulatory Visit: Payer: Self-pay

## 2023-08-15 DIAGNOSIS — H532 Diplopia: Secondary | ICD-10-CM | POA: Insufficient documentation

## 2023-08-15 DIAGNOSIS — Z419 Encounter for procedure for purposes other than remedying health state, unspecified: Secondary | ICD-10-CM | POA: Diagnosis not present

## 2023-08-15 HISTORY — DX: Anemia, unspecified: D64.9

## 2023-08-15 HISTORY — DX: Other specified postprocedural states: Z98.890

## 2023-08-15 HISTORY — PX: STRABISMUS SURGERY: SHX218

## 2023-08-15 HISTORY — DX: Personal history of other diseases of the nervous system and sense organs: Z86.69

## 2023-08-15 LAB — POCT PREGNANCY, URINE: Preg Test, Ur: NEGATIVE

## 2023-08-15 SURGERY — REPAIR STRABISMUS
Anesthesia: General | Site: Eye | Laterality: Right

## 2023-08-15 MED ORDER — LIDOCAINE-EPINEPHRINE 2 %-1:100000 IJ SOLN
INTRAMUSCULAR | Status: AC
  Filled 2023-08-15: qty 1

## 2023-08-15 MED ORDER — BSS IO SOLN
INTRAOCULAR | Status: DC | PRN
  Administered 2023-08-15: 15 mL

## 2023-08-15 MED ORDER — ORAL CARE MOUTH RINSE: 15.0000 mL | Freq: Once | OROMUCOSAL | Status: AC

## 2023-08-15 MED ORDER — BSS IO SOLN
INTRAOCULAR | Status: AC
  Filled 2023-08-15: qty 15

## 2023-08-15 MED ORDER — GLYCOPYRROLATE PF 0.2 MG/ML IJ SOSY
PREFILLED_SYRINGE | INTRAMUSCULAR | Status: AC
Start: 2023-08-15 — End: ?
  Filled 2023-08-15: qty 1

## 2023-08-15 MED ORDER — GLYCOPYRROLATE PF 0.2 MG/ML IJ SOSY
PREFILLED_SYRINGE | INTRAMUSCULAR | Status: DC | PRN
  Administered 2023-08-15: .2 mg via INTRAVENOUS

## 2023-08-15 MED ORDER — PROPOFOL 10 MG/ML IV BOLUS
INTRAVENOUS | Status: DC | PRN
  Administered 2023-08-15: 150 mg via INTRAVENOUS

## 2023-08-15 MED ORDER — MIDAZOLAM HCL 2 MG/2ML IJ SOLN
INTRAMUSCULAR | Status: DC | PRN
  Administered 2023-08-15: 2 mg via INTRAVENOUS

## 2023-08-15 MED ORDER — ACETAMINOPHEN 325 MG PO TABS
325.0000 mg | ORAL_TABLET | ORAL | Status: DC | PRN
Start: 2023-08-15 — End: 2023-08-16

## 2023-08-15 MED ORDER — SODIUM CHLORIDE 0.9 % IV SOLN: INTRAVENOUS | Status: DC

## 2023-08-15 MED ORDER — LACTATED RINGERS IV SOLN: INTRAVENOUS | Status: DC | PRN

## 2023-08-15 MED ORDER — OXYCODONE HCL 5 MG PO TABS: 5.0000 mg | ORAL_TABLET | Freq: Once | ORAL | Status: DC | PRN

## 2023-08-15 MED ORDER — DEXMEDETOMIDINE HCL IN NACL 80 MCG/20ML IV SOLN
INTRAVENOUS | Status: DC | PRN
  Administered 2023-08-15 (×2): 8 ug via INTRAVENOUS
  Administered 2023-08-15: 12 ug via INTRAVENOUS
  Administered 2023-08-15: 4 ug via INTRAVENOUS
  Administered 2023-08-15: 8 ug via INTRAVENOUS

## 2023-08-15 MED ORDER — TETRACAINE HCL 0.5 % OP SOLN
OPHTHALMIC | Status: AC
  Filled 2023-08-15: qty 4

## 2023-08-15 MED ORDER — FENTANYL CITRATE (PF) 250 MCG/5ML IJ SOLN
INTRAMUSCULAR | Status: DC | PRN
  Administered 2023-08-15: 50 ug via INTRAVENOUS
  Administered 2023-08-15 (×2): 25 ug via INTRAVENOUS
  Administered 2023-08-15: 100 ug via INTRAVENOUS
  Administered 2023-08-15: 50 ug via INTRAVENOUS

## 2023-08-15 MED ORDER — DEXMEDETOMIDINE HCL IN NACL 80 MCG/20ML IV SOLN
INTRAVENOUS | Status: AC
  Filled 2023-08-15: qty 20

## 2023-08-15 MED ORDER — FENTANYL CITRATE (PF) 100 MCG/2ML IJ SOLN: 25.0000 ug | INTRAMUSCULAR | Status: DC | PRN

## 2023-08-15 MED ORDER — LIDOCAINE 2% (20 MG/ML) 5 ML SYRINGE
INTRAMUSCULAR | Status: AC
  Filled 2023-08-15: qty 5

## 2023-08-15 MED ORDER — ACETAMINOPHEN 160 MG/5ML PO SOLN: 325.0000 mg | ORAL | Status: DC | PRN

## 2023-08-15 MED ORDER — PHENYLEPHRINE HCL 2.5 % OP SOLN
OPHTHALMIC | Status: AC
  Filled 2023-08-15: qty 2

## 2023-08-15 MED ORDER — ROCURONIUM BROMIDE 10 MG/ML (PF) SYRINGE
PREFILLED_SYRINGE | INTRAVENOUS | Status: DC | PRN
  Administered 2023-08-15: 20 mg via INTRAVENOUS
  Administered 2023-08-15: 50 mg via INTRAVENOUS

## 2023-08-15 MED ORDER — PROPOFOL 10 MG/ML IV BOLUS
INTRAVENOUS | Status: AC
  Filled 2023-08-15: qty 20

## 2023-08-15 MED ORDER — PROPOFOL 500 MG/50ML IV EMUL
INTRAVENOUS | Status: DC | PRN
  Administered 2023-08-15: 95 ug/kg/min via INTRAVENOUS

## 2023-08-15 MED ORDER — KETOROLAC TROMETHAMINE 30 MG/ML IJ SOLN
INTRAMUSCULAR | Status: DC | PRN
  Administered 2023-08-15: 30 mg via INTRAVENOUS

## 2023-08-15 MED ORDER — TOBRAMYCIN-DEXAMETHASONE 0.3-0.1 % OP SUSP
OPHTHALMIC | Status: DC | PRN
Start: 2023-08-15 — End: 2023-08-15
  Administered 2023-08-15: 1 [drp] via OPHTHALMIC

## 2023-08-15 MED ORDER — PROPOFOL 1000 MG/100ML IV EMUL
INTRAVENOUS | Status: AC
  Filled 2023-08-15: qty 100

## 2023-08-15 MED ORDER — PHENYLEPHRINE HCL-NACL 20-0.9 MG/250ML-% IV SOLN
INTRAVENOUS | Status: DC | PRN
  Administered 2023-08-15: 25 ug/min via INTRAVENOUS

## 2023-08-15 MED ORDER — TOBRAMYCIN-DEXAMETHASONE 0.3-0.1 % OP OINT
TOPICAL_OINTMENT | OPHTHALMIC | Status: AC
  Filled 2023-08-15: qty 3.5

## 2023-08-15 MED ORDER — CHLORHEXIDINE GLUCONATE 0.12 % MT SOLN: 15.0000 mL | Freq: Once | OROMUCOSAL | Status: AC

## 2023-08-15 MED ORDER — MIDAZOLAM HCL 2 MG/2ML IJ SOLN
INTRAMUSCULAR | Status: AC
  Filled 2023-08-15: qty 2

## 2023-08-15 MED ORDER — LIDOCAINE 2% (20 MG/ML) 5 ML SYRINGE
INTRAMUSCULAR | Status: DC | PRN
  Administered 2023-08-15: 40 mg via INTRAVENOUS

## 2023-08-15 MED ORDER — MEPERIDINE HCL 25 MG/ML IJ SOLN: 6.2500 mg | INTRAMUSCULAR | Status: DC | PRN

## 2023-08-15 MED ORDER — ROCURONIUM BROMIDE 10 MG/ML (PF) SYRINGE
PREFILLED_SYRINGE | INTRAVENOUS | Status: AC
  Filled 2023-08-15: qty 10

## 2023-08-15 MED ORDER — SUGAMMADEX SODIUM 200 MG/2ML IV SOLN
INTRAVENOUS | Status: DC | PRN
  Administered 2023-08-15: 200 mg via INTRAVENOUS

## 2023-08-15 MED ORDER — ONDANSETRON HCL 4 MG/2ML IJ SOLN: 4.0000 mg | Freq: Once | INTRAMUSCULAR | Status: DC | PRN

## 2023-08-15 MED ORDER — TOBRADEX 0.3-0.1 % OP OINT: 1.0000 | TOPICAL_OINTMENT | Freq: Two times a day (BID) | OPHTHALMIC | 0 refills | Status: AC

## 2023-08-15 MED ORDER — CEFAZOLIN SODIUM 1 G IJ SOLR
INTRAMUSCULAR | Status: AC
  Filled 2023-08-15: qty 20

## 2023-08-15 MED ORDER — DEXAMETHASONE SODIUM PHOSPHATE 10 MG/ML IJ SOLN
INTRAMUSCULAR | Status: AC
  Filled 2023-08-15: qty 1

## 2023-08-15 MED ORDER — ONDANSETRON HCL 4 MG/2ML IJ SOLN
INTRAMUSCULAR | Status: DC | PRN
  Administered 2023-08-15: 4 mg via INTRAVENOUS

## 2023-08-15 MED ORDER — CHLORHEXIDINE GLUCONATE 0.12 % MT SOLN
OROMUCOSAL | Status: AC
  Administered 2023-08-15: 15 mL via OROMUCOSAL
  Filled 2023-08-15: qty 15

## 2023-08-15 MED ORDER — ONDANSETRON HCL 4 MG/2ML IJ SOLN
INTRAMUSCULAR | Status: AC
  Filled 2023-08-15: qty 2

## 2023-08-15 MED ORDER — DEXAMETHASONE SODIUM PHOSPHATE 10 MG/ML IJ SOLN
INTRAMUSCULAR | Status: DC | PRN
  Administered 2023-08-15: 4 mg via INTRAVENOUS

## 2023-08-15 MED ORDER — FENTANYL CITRATE (PF) 250 MCG/5ML IJ SOLN
INTRAMUSCULAR | Status: AC
  Filled 2023-08-15: qty 5

## 2023-08-15 MED ORDER — CEFAZOLIN SODIUM-DEXTROSE 2-3 GM-%(50ML) IV SOLR
INTRAVENOUS | Status: DC | PRN
  Administered 2023-08-15: 2 g via INTRAVENOUS

## 2023-08-15 MED ORDER — OXYCODONE HCL 5 MG/5ML PO SOLN: 5.0000 mg | Freq: Once | ORAL | Status: DC | PRN

## 2023-08-15 SURGICAL SUPPLY — 31 items
APL SRG 3 HI ABS STRL LF PLS (MISCELLANEOUS) ×1
APPLICATOR DR MATTHEWS STRL (MISCELLANEOUS) ×1 IMPLANT
BAG COUNTER SPONGE SURGICOUNT (BAG) ×1 IMPLANT
BAG SPNG CNTER NS LX DISP (BAG)
BAND WRIST GAS GREEN (MISCELLANEOUS) IMPLANT
BLADE SURG 10 STRL SS (BLADE) IMPLANT
BLADE SURG 15 STRL LF DISP TIS (BLADE) IMPLANT
BLADE SURG 15 STRL SS (BLADE)
BNDG GZE 12X3 1 PLY HI ABS (GAUZE/BANDAGES/DRESSINGS)
BNDG STRETCH GAUZE 3IN X12FT (GAUZE/BANDAGES/DRESSINGS) IMPLANT
CAUTERY EYE LOW TEMP OLD (MISCELLANEOUS) IMPLANT
COVER SURGICAL LIGHT HANDLE (MISCELLANEOUS) ×1 IMPLANT
DRAPE SURG 17X23 STRL (DRAPES) ×3 IMPLANT
GAUZE SPONGE 4X4 12PLY STRL (GAUZE/BANDAGES/DRESSINGS) ×1 IMPLANT
GLOVE ECLIPSE 7.0 STRL STRAW (GLOVE) ×1 IMPLANT
GLOVE SURG SIGNA 7.5 PF LTX (GLOVE) ×2 IMPLANT
GOWN STRL REUS W/ TWL LRG LVL3 (GOWN DISPOSABLE) ×2 IMPLANT
GOWN STRL REUS W/TWL LRG LVL3 (GOWN DISPOSABLE) ×2
KIT BASIN OR (CUSTOM PROCEDURE TRAY) ×1 IMPLANT
KIT TURNOVER KIT B (KITS) ×1 IMPLANT
NDL PRECISIONGLIDE 27X1.5 (NEEDLE) IMPLANT
NEEDLE PRECISIONGLIDE 27X1.5 (NEEDLE) IMPLANT
NS IRRIG 1000ML POUR BTL (IV SOLUTION) ×1 IMPLANT
PACK CATARACT CUSTOM (CUSTOM PROCEDURE TRAY) ×1 IMPLANT
PAD ARMBOARD 7.5X6 YLW CONV (MISCELLANEOUS) ×2 IMPLANT
POSITIONER HEAD DONUT 9IN (MISCELLANEOUS) ×1 IMPLANT
STRIP CLOSURE SKIN 1/2X4 (GAUZE/BANDAGES/DRESSINGS) ×1 IMPLANT
SUT VICRYL 6 0 S 29 12 (SUTURE) ×1 IMPLANT
TOWEL GREEN STERILE FF (TOWEL DISPOSABLE) ×2 IMPLANT
WATER STERILE IRR 1000ML POUR (IV SOLUTION) ×1 IMPLANT
WIPE INSTRUMENT VISIWIPE 73X73 (MISCELLANEOUS) ×1 IMPLANT

## 2023-08-15 NOTE — Anesthesia Procedure Notes (Addendum)
Procedure Name: Intubation Date/Time: 08/15/2023 5:41 PM  Performed by: Ayesha Rumpf, CRNAPre-anesthesia Checklist: Patient identified, Emergency Drugs available, Suction available and Patient being monitored Patient Re-evaluated:Patient Re-evaluated prior to induction Oxygen Delivery Method: Circle System Utilized Preoxygenation: Pre-oxygenation with 100% oxygen Induction Type: IV induction Ventilation: Mask ventilation without difficulty Laryngoscope Size: 4 and Mac Grade View: Grade I Tube type: Oral Tube size: 7.0 mm Number of attempts: 1 Airway Equipment and Method: Stylet and Oral airway Placement Confirmation: ETT inserted through vocal cords under direct vision, positive ETCO2 and breath sounds checked- equal and bilateral Secured at: 21 cm Tube secured with: Tape Dental Injury: Teeth and Oropharynx as per pre-operative assessment

## 2023-08-15 NOTE — Anesthesia Postprocedure Evaluation (Signed)
Anesthesia Post Note  Patient: Nurse, adult  Procedure(s) Performed: REPAIR STRABISMUS (Right: Eye)     Patient location during evaluation: PACU Anesthesia Type: General Level of consciousness: awake and alert, patient cooperative and oriented Pain management: pain level controlled Vital Signs Assessment: post-procedure vital signs reviewed and stable Respiratory status: spontaneous breathing, nonlabored ventilation and respiratory function stable Cardiovascular status: blood pressure returned to baseline and stable Postop Assessment: no apparent nausea or vomiting and able to ambulate Anesthetic complications: no   No notable events documented.  Last Vitals:  Vitals:   08/15/23 1945 08/15/23 2000  BP: 122/79 136/86  Pulse: 69 79  Resp: 14 11  Temp: 36.6 C 36.6 C  SpO2: 98% 95%    Last Pain:  Vitals:   08/15/23 2000  TempSrc:   PainSc: 0-No pain                 [name],[name]

## 2023-08-15 NOTE — Brief Op Note (Signed)
08/15/2023  7:09 PM  PATIENT:  Samantha Sweeney  42 y.o. female  PRE-OPERATIVE DIAGNOSIS:  strabismus repair  POST-OPERATIVE DIAGNOSIS:  strabismus repair  PROCEDURE:  Procedure(s): REPAIR STRABISMUS (Right)  SURGEON:  Surgeons and Role:    Aura Camps, MD - Primary  PHYSICIAN ASSISTANT:   ASSISTANTS: none   ANESTHESIA:   general  EBL:       none  BLOOD ADMINISTERED:none  DRAINS: none   LOCAL MEDICATIONS USED:  NONE  SPECIMEN:  No Specimen  DISPOSITION OF SPECIMEN:  N/A  COUNTS:  YES  TOURNIQUET:  * No tourniquets in log *  DICTATION: .Other Dictation: Dictation Number [id number]  PLAN OF CARE: Discharge to home after PACU  PATIENT DISPOSITION:  PACU - hemodynamically stable.   Delay start of Pharmacological VTE agent (>24hrs) due to surgical blood loss or risk of bleeding: yes

## 2023-08-15 NOTE — Progress Notes (Signed)
Patient made aware to remain NPO. Last solid was a mentos candy at 0945. Clear liquids until 1400. Given arrival time of 1430 unless she hears otherwise from the OR.

## 2023-08-15 NOTE — Interval H&P Note (Signed)
History and Physical Interval Note:  08/15/2023 5:16 PM  Samantha Sweeney  has presented today for surgery, with the diagnosis of strabismus repair.  The various methods of treatment have been discussed with the patient and family. After consideration of risks, benefits and other options for treatment, the patient has consented to  Procedure(s): REPAIR STRABISMUS (N/A) as a surgical intervention.  The patient's history has been reviewed, patient examined, no change in status, stable for surgery.  I have reviewed the patient's chart and labs.  Questions were answered to the patient's satisfaction.     Aura Camps

## 2023-08-15 NOTE — H&P (Signed)
Samantha Sweeney is an 42 y.o. female.   Chief Complaint: Post eye muscle surgery double vision. HPI: 42 y.o. 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 Pt  presents for emergent strabismus repair under general anesthesia.  History reviewed. No pertinent past medical history.  Past Surgical History:  Procedure Laterality Date  . BREAST CYST EXCISION N/A 05/31/2014   Procedure: CYST EXCISION BREAST;  Surgeon: Adolph Pollack, MD;  Location: [facility];  Service: General;  Laterality: N/A;  complex incision, drainage, debridement of left breast abcesses  . BREAST EXCISIONAL BIOPSY Left 2019   abcess  . BREAST EXCISIONAL BIOPSY Left 2021   abcess  . BREAST SURGERY  2019   L breast abscess  . IRRIGATION AND DEBRIDEMENT ABSCESS Left 11/24/2019   Procedure: IRRIGATION AND DEBRIDEMENT BREAST ABSCESS;  Surgeon: Emelia Loron, MD;  Location: [facility];  Service: General;  Laterality: Left;  . WISDOM TOOTH EXTRACTION  2005    Family History  Problem Relation Age of Onset  . Breast cancer Mother 28       diag 2 different times  . Arthritis Mother   . Cancer Mother   . Thyroid disease Mother   . Cervical cancer Maternal Grandmother   . Cancer Maternal Grandmother   . Diabetes Father   . Breast cancer Other   . Breast cancer Other    Social History:  reports that she has never smoked. She has never used smokeless tobacco. She reports that she does not drink alcohol and does not use drugs.  Allergies: No Known Allergies  No medications prior to admission.    No results found for this or any previous visit (from the past 48 hour(s)). No results found.  Review of Systems  Constitutional: Negative.   HENT: Negative.    Eyes:        Post op injection : diplopia  Respiratory: Negative.    Endocrine: Negative.   Musculoskeletal:  Negative.   Skin: Negative.   Neurological: Negative.   Hematological: Negative.     unknown if currently breastfeeding. Physical Exam Constitutional:      Appearance: Normal appearance. She is normal weight.  HENT:     Head: Normocephalic and atraumatic.  Cardiovascular:     Rate and Rhythm: Normal rate and regular rhythm.  Pulmonary:     Effort: Pulmonary effort is normal.  Musculoskeletal:     Cervical back: Normal range of motion.  Skin:    General: Skin is warm.  Neurological:     General: No focal deficit present.     Mental Status: She is alert and oriented to person, place, and time.     Assessment/Plan Diplopia : vertical /torsional od :  Strabismus repair od under general anesthesia.  Aura Camps, MD 08/15/2023, 12:50 PM

## 2023-08-15 NOTE — Anesthesia Preprocedure Evaluation (Addendum)
Anesthesia Evaluation  Patient identified by MRN, date of birth, ID band Patient awake    Reviewed: Allergy & Precautions, H&P , NPO status , Patient's Chart, lab work & pertinent test results, reviewed documented beta blocker date and time   History of Anesthesia Complications (+) PONV  Airway Mallampati: I  TM Distance: >3 FB Neck ROM: full    Dental no notable dental hx. (+) Teeth Intact, Dental Advisory Given   Pulmonary neg pulmonary ROS   Pulmonary exam normal breath sounds clear to auscultation       Cardiovascular Exercise Tolerance: Good negative cardio ROS Normal cardiovascular exam Rhythm:regular Rate:Normal     Neuro/Psych negative neurological ROS  negative psych ROS   GI/Hepatic negative GI ROS, Neg liver ROS,,,  Endo/Other  negative endocrine ROS    Renal/GU negative Renal ROS  negative genitourinary   Musculoskeletal negative musculoskeletal ROS (+)    Abdominal   Peds negative pediatric ROS (+)  Hematology negative hematology ROS (+)   Anesthesia Other Findings    Reproductive/Obstetrics negative OB ROS                             Anesthesia Physical Anesthesia Plan  ASA: 2 and emergent  Anesthesia Plan: General   Post-op Pain Management:    Induction: Intravenous  PONV Risk Score and Plan: 4 or greater and Dexamethasone, Ondansetron and TIVA  Airway Management Planned: Oral ETT  Additional Equipment: None  Intra-op Plan:   Post-operative Plan: Extubation in OR  Informed Consent: I have reviewed the patients History and Physical, chart, labs and discussed the procedure including the risks, benefits and alternatives for the proposed anesthesia with the patient or authorized representative who has indicated his/her understanding and acceptance.     Dental Advisory Given and Dental advisory given  Plan Discussed with: CRNA and  Anesthesiologist  Anesthesia Plan Comments: (Scop patch still on from 2 days ago.  Discussed TIVA,  Pt was sick after surgery last go round.)        Anesthesia Quick Evaluation

## 2023-08-15 NOTE — Transfer of Care (Signed)
Immediate Anesthesia Transfer of Care Note  Patient: Samantha Sweeney  Procedure(s) Performed: REPAIR STRABISMUS (Right: Eye)  Patient Location: PACU  Anesthesia Type:General  Level of Consciousness: awake and alert   Airway & Oxygen Therapy: Patient Spontanous Breathing and Patient connected to nasal cannula oxygen  Post-op Assessment: Report given to RN and Post -op Vital signs reviewed and stable  Post vital signs: Reviewed and stable  Last Vitals:  Vitals Value Taken Time  BP 139/78 08/15/23 1918  Temp 36.6 C 08/15/23 1918  Pulse 74 08/15/23 1924  Resp 12 08/15/23 1924  SpO2 100 % 08/15/23 1924  Vitals shown include unfiled device data.  Last Pain:  Vitals:   08/15/23 1524  TempSrc: Oral  PainSc: 2       Patients Stated Pain Goal: 1 (08/15/23 1524)  Complications: No notable events documented.

## 2023-08-15 NOTE — Discharge Instructions (Signed)
Advance to P.O.  as tolerated . Cool compresses od Q5 mins as tolerated  D/C IVF when P.O. intake adequate. D/C to home post VSS .  F/U @[facility] x 1wk.

## 2023-08-16 ENCOUNTER — Encounter (HOSPITAL_COMMUNITY): Payer: Self-pay | Admitting: Ophthalmology

## 2023-08-16 NOTE — Op Note (Unsigned)
NAMECHELSEA, Sweeney MEDICAL RECORD NO: [id number] ACCOUNT NO: [id number] DATE OF BIRTH: [birth year] FACILITY: [facility] LOCATION: [facility] PHYSICIAN: Tyrone Apple. Karleen Hampshire, MD  Operative Report   DATE OF PROCEDURE: 08/15/2023  PREOPERATIVE DIAGNOSIS: Postoperative diplopia.  POSTOPERATIVE DIAGNOSIS: Status post right superior rectus revision, right inferior rectus revision, and augmentation suture removal.  PROCEDURES PERFORMED: Superior rectus tendon revision and inferior rectus tendon revision, also flossed augmentation sutures revision.  SURGEON: Tyrone Apple. Karleen Hampshire, MD  ANESTHESIA: General laryngeal mask airway.  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  To treat this, the patient underwent a full  tendon transposition of the right superior rectus to the right medial rectus insertion and the right inferior rectus to the right medial rectus insertion with flossed augmentation sutures on 08/13/2023.  She also had a left lateral rectus recession  unadjustable suture with posterior fixation sutures.  Postoperative adjustment horizontal deviation was controlled.  The patient had a secondary induced vertical deviation right hypertropia with torsion, which was not able to be adjusted at the time in  the postoperative recovery room.  Subsequent to this, the torsion and vertical diplopia did not resolve.  The patient was reassessed on this date of 08/15/2023, and measured preoperative deviation of approximately 5 prism diopters, vertical deviation of  the right eye and with torsion, and this indicated revision of the position of the vertical muscles in the transposition.  The augmentation was noted not to be giving as much effect as desired, but did not appear to be contributing to the deviation.   Subsequently, this patient is scheduled  for revision of position of the vertical recti muscle with revision of the augmentation.  DESCRIPTION OF PROCEDURE:  The patient was taken to the operating room and placed in the supine position.  The entire face was prepped and draped in the usual sterile fashion.  Attention was first directed to the right eye.  A lid speculum was placed.   The globe was held at the superior nasal limbus.  The eye was depressed and abducted and a Stevens hook was then used to enter the previously placed incision of the superior fornix and the right superior rectus tendon was then isolated on a Stevens hook  and subsequently passed through [employer].  The tendon was then imbricated with 6-0 Vicryl suture taking two locking bites in the medial temporal apices and the suture was placed on the Seraphin. Next, the ipsilateral medial rectus tendon was isolated  on a Stevens hook, subsequently green hook and this was used to hold the globe in an abducted position.  A small Desmarres retractor was placed in the incision site and this was used to visualize the posterior fixation suture, augmentation suture which  was removed.  Next, the superior rectus tendon was dissected free from its transposed position and returned to its original position at 7.7 mm from the limbus in the superior to superior limbus.  It was reattached in this position with the pre-placed  sutures. The conjunctiva was repositioned.  My attention was then directed to the ipsilateral right inferior rectus tendon.  The tendon was isolated via two Stevens hooks through the prior placed incision inferotemporally and the inferior rectus tendon  was then imbricated with 6-0 Vicryl sutures to take two locking bites medial and temporal apices at its transposed position.  These were then placed  on Seraphins and the globe was then held at the native incision site at 6.5 mm from the inferior limbus  with two Castroviejo forceps.  Next, the imbricated sutures were placed  on the Seraphin and my attention was directed to the ipsilateral augmentation, which was exposed using a small dura retractor and then removed.  Next, the tendon was then dissected  free from the globe and it was then transposed to its native insertion at 6.5 mm from the inferior limbus, reattached the globe in this position.  The suture was tied securely and the conjunctiva were repositioned.  At the conclusion of the procedure,  TobraDex ointment was instilled in the femoral fornices of the right eye.  There were no apparent complications.   MUK D: 08/15/2023 7:17:23 pm T: 08/16/2023 12:14:00 am  JOB: [id number]/ [id number]

## 2023-09-14 DIAGNOSIS — Z419 Encounter for procedure for purposes other than remedying health state, unspecified: Secondary | ICD-10-CM | POA: Diagnosis not present

## 2023-10-15 DIAGNOSIS — Z419 Encounter for procedure for purposes other than remedying health state, unspecified: Secondary | ICD-10-CM | POA: Diagnosis not present

## 2023-11-15 DIAGNOSIS — Z419 Encounter for procedure for purposes other than remedying health state, unspecified: Secondary | ICD-10-CM | POA: Diagnosis not present

## 2023-12-13 DIAGNOSIS — Z419 Encounter for procedure for purposes other than remedying health state, unspecified: Secondary | ICD-10-CM | POA: Diagnosis not present

## 2024-01-14 ENCOUNTER — Other Ambulatory Visit: Payer: Self-pay | Admitting: Obstetrics and Gynecology

## 2024-01-14 DIAGNOSIS — Z1231 Encounter for screening mammogram for malignant neoplasm of breast: Secondary | ICD-10-CM

## 2024-01-24 DIAGNOSIS — Z419 Encounter for procedure for purposes other than remedying health state, unspecified: Secondary | ICD-10-CM | POA: Diagnosis not present

## 2024-01-28 ENCOUNTER — Ambulatory Visit
Admission: RE | Admit: 2024-01-28 | Discharge: 2024-01-28 | Disposition: A | Discharge status: Home / Self Care | Source: Ambulatory Visit | Attending: Obstetrics and Gynecology | Admitting: Obstetrics and Gynecology

## 2024-01-28 DIAGNOSIS — Z1231 Encounter for screening mammogram for malignant neoplasm of breast: Secondary | ICD-10-CM

## 2024-02-23 DIAGNOSIS — Z419 Encounter for procedure for purposes other than remedying health state, unspecified: Secondary | ICD-10-CM | POA: Diagnosis not present

## 2024-03-25 DIAGNOSIS — Z419 Encounter for procedure for purposes other than remedying health state, unspecified: Secondary | ICD-10-CM | POA: Diagnosis not present

## 2024-04-24 DIAGNOSIS — Z419 Encounter for procedure for purposes other than remedying health state, unspecified: Secondary | ICD-10-CM | POA: Diagnosis not present

## 2024-05-25 DIAGNOSIS — Z419 Encounter for procedure for purposes other than remedying health state, unspecified: Secondary | ICD-10-CM | POA: Diagnosis not present

## 2024-06-25 DIAGNOSIS — Z419 Encounter for procedure for purposes other than remedying health state, unspecified: Secondary | ICD-10-CM | POA: Diagnosis not present

## 2024-08-25 DIAGNOSIS — Z419 Encounter for procedure for purposes other than remedying health state, unspecified: Secondary | ICD-10-CM | POA: Diagnosis not present
